# Patient Record
Sex: Female | Born: 1980 | Race: Black or African American | Hispanic: No | Smoking: Never smoker
Health system: Southern US, Community
[De-identification: ages and names within clinical notes are randomized; demographics above are authoritative.]

## PROBLEM LIST (undated history)

## (undated) DIAGNOSIS — F329 Major depressive disorder, single episode, unspecified: Secondary | ICD-10-CM

## (undated) DIAGNOSIS — I7782 Antineutrophilic cytoplasmic antibody (ANCA) vasculitis: Secondary | ICD-10-CM

## (undated) DIAGNOSIS — I10 Essential (primary) hypertension: Secondary | ICD-10-CM

## (undated) DIAGNOSIS — D649 Anemia, unspecified: Secondary | ICD-10-CM

## (undated) DIAGNOSIS — I776 Arteritis, unspecified: Secondary | ICD-10-CM

## (undated) DIAGNOSIS — J45909 Unspecified asthma, uncomplicated: Secondary | ICD-10-CM

## (undated) DIAGNOSIS — F419 Anxiety disorder, unspecified: Secondary | ICD-10-CM

## (undated) DIAGNOSIS — E559 Vitamin D deficiency, unspecified: Secondary | ICD-10-CM

## (undated) DIAGNOSIS — F32A Depression, unspecified: Secondary | ICD-10-CM

## (undated) DIAGNOSIS — M797 Fibromyalgia: Secondary | ICD-10-CM

## (undated) DIAGNOSIS — K219 Gastro-esophageal reflux disease without esophagitis: Secondary | ICD-10-CM

## (undated) HISTORY — PX: OTHER SURGICAL HISTORY: SHX169

## (undated) HISTORY — PX: ABDOMINAL HYSTERECTOMY: SHX81

## (undated) HISTORY — PX: CHOLECYSTECTOMY: SHX55

---

## 2014-11-27 ENCOUNTER — Ambulatory Visit: Payer: Medicaid Other | Attending: Orthopedic Surgery | Admitting: Physical Therapy

## 2014-11-27 ENCOUNTER — Encounter: Payer: Self-pay | Admitting: Physical Therapy

## 2014-11-27 DIAGNOSIS — M25562 Pain in left knee: Secondary | ICD-10-CM | POA: Diagnosis not present

## 2014-11-27 DIAGNOSIS — R531 Weakness: Secondary | ICD-10-CM | POA: Insufficient documentation

## 2014-11-27 NOTE — Therapy (Signed)
[facility] [address] [place], [place], [address] Phone: [phone]   Fax:  [phone]  Physical Therapy Evaluation  Patient Details  Name: Madeline Carr MRN: [id number] Date of Birth: 1980-08-13 Referring Provider:  Dedra Skeens, PA-C  Encounter Date: 11/27/2014      PT End of Session - 11/27/14 1406    Visit Number 1   Number of Visits 1   Authorization - Visit Number 1   Authorization - Number of Visits 1   PT Start Time [phone]   PT Stop Time 1025   PT Time Calculation (min) 47 min   Activity Tolerance Patient tolerated treatment well;Patient limited by pain   Behavior During Therapy [facility] for tasks assessed/performed      History reviewed. No pertinent past medical history.  History reviewed. No pertinent past surgical history.  There were no vitals filed for this visit.  Visit Diagnosis:  Pain in joint of left knee  Generalized weakness      Subjective Assessment - 11/27/14 1739    Subjective Pt reports no injury to L knee just gradual onset of pain starting in May of 2016. Pt reports 9/10 L knee pain. Pt reports only being able to do one flight of steps in her home. Pt states she is unable to squat down and pick up her 32 year old daughter due to knee pain.    Limitations Sitting;Standing;Walking;House hold activities   How long can you sit comfortably? < 20 min   How long can you stand comfortably? < 20 min    How long can you walk comfortably? < 20 min   Diagnostic tests XRAY   Patient Stated Goals return to walking/playing with children/household activities   Currently in Pain? Yes   Pain Score 9    Pain Location Knee   Pain Orientation Anterior;Left   Pain Descriptors / Indicators Burning;Constant   Pain Type Chronic pain   Pain Onset More than a month ago   Pain Frequency Constant     OBJECTIVE: There ex: squats with toes straight/toes out/toes x 10 each. Standing hip abduction x10 each leg. Standing  marching. (given as HEP). Attempted stretching to piriformis, hip flexors and hamstring limited by muscle guarding due to pain/fearful of pain.         PT Education - 11/27/14 1405    Education provided Yes   Education Details See handout above. Pt educated on importance of icing and wearing her knee brace.    Person(s) Educated Patient   Methods Explanation;Demonstration;Verbal cues;Handout   Comprehension Returned demonstration;Verbalized understanding            Plan - 11/27/14 1727    Clinical Impression Statement Pt is a 34 y.o F who presents with L knee pain onset in May 2016 with unspecificed origin. Pt ambulates with a SPC on R side for all functional mobility and is only performing one flight of stairs at home due to knee pain. Pt reports L knee pain at a 9/10 currently at rest. Attempted to MMT L LE but pt unable to acheive proper position. Pt's R LE strength grossly 4/5 MMT.  Tigthness and impaired flexibility with hamstring stretching noted B.  Pt unable to tolerate patellar mobilizations on the L knee due to pain.  L knee edema noted as compared to R.  Pt educated on HEP for quad strengthening and stretching to decrease stress on the anterior knee. LEFS: 17/80.  Pt. limited to 1 evaluation/treatment due  to insurance limitations.  Pt. will be referred to [facility] clinic to continue skilled PT services.     Pt will benefit from skilled therapeutic intervention in order to improve on the following deficits Abnormal gait;Decreased activity tolerance;Decreased mobility;Increased edema;Decreased strength;Impaired flexibility;Improper body mechanics;Postural dysfunction;Decreased range of motion;Decreased endurance   Rehab Potential Good   PT Frequency 1x / week   PT Duration --  1 week   PT Treatment/Interventions ADLs/Self Care Home Management;Cryotherapy;Moist Heat;Balance training;Therapeutic exercise;Therapeutic activities;Functional mobility training;Stair training;Gait  training;Manual techniques   PT Next Visit Plan 1x visit only.  Referred to [facility].   PT Home Exercise Plan see above handout.    Consulted and Agree with Plan of Care Patient         Problem List There are no active problems to display for this patient.  Cammie Mcgee, PT, DPT # [phone]   11/28/2014, 9:36 AM  [facility] [address] [place], [place], [address] Phone: [phone]   Fax:  [phone]

## 2014-12-02 ENCOUNTER — Ambulatory Visit
Admission: EM | Admit: 2014-12-02 | Discharge: 2014-12-02 | Disposition: A | Discharge status: Home / Self Care | Payer: Medicaid Other | Attending: Emergency Medicine | Admitting: Emergency Medicine

## 2014-12-02 DIAGNOSIS — M222X2 Patellofemoral disorders, left knee: Secondary | ICD-10-CM

## 2014-12-02 HISTORY — DX: Gastro-esophageal reflux disease without esophagitis: K21.9

## 2014-12-02 HISTORY — DX: Unspecified asthma, uncomplicated: J45.909

## 2014-12-02 HISTORY — DX: Fibromyalgia: M79.7

## 2014-12-02 HISTORY — DX: Depression, unspecified: F32.A

## 2014-12-02 HISTORY — DX: Major depressive disorder, single episode, unspecified: F32.9

## 2014-12-02 HISTORY — DX: Anxiety disorder, unspecified: F41.9

## 2014-12-02 MED ORDER — KETOROLAC TROMETHAMINE 60 MG/2ML IM SOLN
60.0000 mg | Freq: Once | INTRAMUSCULAR | Status: AC
  Administered 2014-12-02: 60 mg via INTRAMUSCULAR

## 2014-12-02 NOTE — ED Notes (Addendum)
Left knee pain since May 2016. Almedia Balls PA with [facility] on 11/22/14 and Dx with Patellofemoral Stress syndrome. Had Labs drawn and elevated c-reactive protein and elevated sed rate. Waiting for an appointment with Rheumatology. Had PT evaluation last Wednesday.

## 2014-12-03 NOTE — ED Provider Notes (Signed)
CSN: [id number]     Arrival date & time 12/02/14  1608 History   First MD Initiated Contact with Patient 12/02/14 1741     Chief Complaint  Patient presents with  . Knee Pain   (Consider location/radiation/quality/duration/timing/severity/associated sxs/prior Treatment) HPI 34 yo F presents complaining of left knee pain. Reports present since May 2016. Noted as she climbed into the drivers seat -spontaneous onset -no trauma remembered. Pain centered around the left knee. Difficulty weight bearing. Has used home cane and has ice wraps. With review-she is currently under the direct care of Orthopedics. She was seen on 9/16 and diagnoses with patellofemoral stress syndrome. It was understood this would be an uncomfortable healing process.Possibly delayed by habitus at 300 lbs, She was placed on Meloxicam 7.5 mg BID but on review has been taking it as well as 600-800 mg of ibuprofen multiple times a day. Experience nausea and vomiting and belly pain just a few days ago Drove herself to [facility] this evening Had elevated CRP and Sed Rate noted during previous visit with Ortho- they are setting up Rheumatology referral/re-evaluation Past Medical History  Diagnosis Date  . GERD (gastroesophageal reflux disease)   . Asthma   . Anxiety   . Depression   . Fibromyalgia    Past Surgical History  Procedure Laterality Date  . Cholecystectomy    . Abdominal hysterectomy    . Cesarean section      2005; 2011  . Uterine ablation     Family History  Problem Relation Age of Onset  . Thyroid disease Mother    Social History  Substance Use Topics  . Smoking status: Never Smoker   . Smokeless tobacco: None  . Alcohol Use: No   OB History    No data available    ,lwl Review of Systems Constitutional -afebrile Eyes-denies visual changes ENT- normal voice,denies sore throat CV-denies chest pain Resp-denies SOB GI- negative for nausea,vomiting,  Currently- had episode a few days ago GU-  negative for dysuria MSK- negative for back pain, ambulatory with can , antalgic , favoring left knee Skin- denies acute changes Neuro- negative headache,focal weakness or numbness    Allergies  Prevacid and Shellfish allergy  Home Medications   Prior to Admission medications   Medication Sig Start Date End Date Taking? Authorizing Provider  albuterol (PROVENTIL HFA;VENTOLIN HFA) 108 (90 BASE) MCG/ACT inhaler Inhale into the lungs every 6 (six) hours as needed for wheezing or shortness of breath.   Yes Historical Provider, MD  DULoxetine (CYMBALTA) 60 MG capsule Take 60 mg by mouth daily.   Yes Historical Provider, MD  ergocalciferol (VITAMIN D2) 50000 UNITS capsule Take 50,000 Units by mouth once a week.   Yes Historical Provider, MD  gabapentin (NEURONTIN) 600 MG tablet Take 600 mg by mouth 3 (three) times daily.   Yes Historical Provider, MD  lisinopril-hydrochlorothiazide (PRINZIDE,ZESTORETIC) 20-12.5 MG per tablet Take 1 tablet by mouth daily.   Yes Historical Provider, MD  meloxicam (MOBIC) 7.5 MG tablet Take 7.5 mg by mouth daily.   Yes Historical Provider, MD  omeprazole (PRILOSEC) 40 MG capsule Take 40 mg by mouth daily.   Yes Historical Provider, MD  QUEtiapine (SEROQUEL) 300 MG tablet Take 300 mg by mouth at bedtime.   Yes Historical Provider, MD  sertraline (ZOLOFT) 100 MG tablet Take 100 mg by mouth daily.   Yes Historical Provider, MD  dicyclomine (BENTYL) 20 MG tablet Take 20 mg by mouth every 6 (six) hours.    Historical  Provider, MD   Meds Ordered and Administered this Visit   Medications  ketorolac (TORADOL) injection 60 mg (60 mg Intramuscular Given 12/02/14 1836)    BP 127/97 mmHg  Pulse 108  Temp(Src) 98.5 F (36.9 C) (Tympanic)  Resp 16  Ht  (1.702 m)  Wt 300 lb (136.079 kg)  BMI 46.98 kg/m2  SpO2 99% No data found.  Constitutional: Alert and oriented, well appearing, VS are noted,  General : mild distress left knee HEENT:  Head:normocephalic,  atraumatic,                Eyes: conjugate gaze,negative conjunctiva     Ears:hearing normal     Nose:normal,     Mouth/throat :Mucous membranes moist Neck :  supple without thyromegaly Lymph: without enlargement Lung:   effort and breath sounds normal , no distress Heart:   normal rate,regular rhythm,  Back:    No CVAT, no spinal tenderness noted Abd :    soft, nontender, bowel sounds present, no mid-epigastric tenderness noted MSK:   Ambulatory with cane and antalgic. got on and off table without assistance; left knee no evidence of increased swelling or fluid; patient  c/o tenderness, good pulses, normal sensation reported by patient. Leg is large and knee specific landmarks difficult to pinpoint. Indicated the entire knee area as that which is uncomfortable. Neuro:;  normal speech and language Skin:  Warm,dry,intact Psych: mood and affect WNL Physical Exam As noted ED Course  Procedures (including critical care time)  Labs Review Labs Reviewed - No data to display  Imaging Review No results found.  Discussed ice and elevation. Strongly discouraged the use of po Ibuprofen especially in large doses to patient.while taking Meloxicam on standard dose . Need to use Tylenol if back up additional is needed. Has not used ibuprofen today. Will Rx with Ketorolac as she is driving herself and she is  alone this weekend Medications  ketorolac (TORADOL) injection 60 mg (60 mg Intramuscular Given 12/02/14 1836)  got significant improvement in first 30 minutes and when eady for discharge  Plan is to call Ortho in the morning and discuss inadequate pain control so another plan can be developed. F/U currently not scheduled Ortho  for another 2 weeks Follow up on plan for Rheumatology visit  For specifics on your referral. Continue ice, elevation and use of crutches or cane to reduce weightbearing as desired  MDM   1. Patellofemoral stress syndrome of left knee     Diagnosis and treatment  discussed. . Questions fielded, expectations and recommendations reviewed.  Patient expresses understanding. Will return to [facility] with questions, concern or exacerbation.    Rae Halsted, PA-C 12/04/14 0003

## 2014-12-04 ENCOUNTER — Encounter: Payer: Self-pay | Admitting: Physician Assistant

## 2015-03-07 ENCOUNTER — Other Ambulatory Visit: Payer: Self-pay | Admitting: Rheumatology

## 2015-03-07 DIAGNOSIS — R7989 Other specified abnormal findings of blood chemistry: Secondary | ICD-10-CM

## 2015-03-07 DIAGNOSIS — G8929 Other chronic pain: Secondary | ICD-10-CM

## 2015-03-07 DIAGNOSIS — R945 Abnormal results of liver function studies: Principal | ICD-10-CM

## 2015-03-07 DIAGNOSIS — M25562 Pain in left knee: Principal | ICD-10-CM

## 2015-03-13 ENCOUNTER — Ambulatory Visit
Admission: RE | Admit: 2015-03-13 | Discharge: 2015-03-13 | Disposition: A | Discharge status: Home / Self Care | Payer: Medicaid Other | Source: Ambulatory Visit | Attending: Rheumatology | Admitting: Rheumatology

## 2015-03-13 DIAGNOSIS — R7989 Other specified abnormal findings of blood chemistry: Secondary | ICD-10-CM

## 2015-03-13 DIAGNOSIS — R945 Abnormal results of liver function studies: Secondary | ICD-10-CM | POA: Diagnosis not present

## 2015-03-28 ENCOUNTER — Ambulatory Visit
Admission: RE | Admit: 2015-03-28 | Discharge: 2015-03-28 | Disposition: A | Discharge status: Home / Self Care | Payer: Medicaid Other | Source: Ambulatory Visit | Attending: Rheumatology | Admitting: Rheumatology

## 2015-03-28 DIAGNOSIS — M2242 Chondromalacia patellae, left knee: Secondary | ICD-10-CM | POA: Diagnosis not present

## 2015-03-28 DIAGNOSIS — M25562 Pain in left knee: Secondary | ICD-10-CM | POA: Insufficient documentation

## 2015-03-28 DIAGNOSIS — G8929 Other chronic pain: Secondary | ICD-10-CM | POA: Diagnosis present

## 2015-03-28 DIAGNOSIS — M25462 Effusion, left knee: Secondary | ICD-10-CM | POA: Diagnosis not present

## 2015-05-20 ENCOUNTER — Ambulatory Visit
Admission: EM | Admit: 2015-05-20 | Discharge: 2015-05-20 | Disposition: A | Discharge status: Home / Self Care | Payer: Medicaid Other | Attending: Family Medicine | Admitting: Family Medicine

## 2015-05-20 DIAGNOSIS — H0016 Chalazion left eye, unspecified eyelid: Secondary | ICD-10-CM

## 2015-05-20 HISTORY — DX: Anemia, unspecified: D64.9

## 2015-05-20 HISTORY — DX: Vitamin D deficiency, unspecified: E55.9

## 2015-05-20 HISTORY — DX: Essential (primary) hypertension: I10

## 2015-05-20 MED ORDER — CEFUROXIME AXETIL 500 MG PO TABS: 500.0000 mg | ORAL_TABLET | Freq: Two times a day (BID) | ORAL | Status: DC

## 2015-05-20 NOTE — ED Provider Notes (Signed)
CSN: [id number]     Arrival date & time 05/20/15  1753 History   First MD Initiated Contact with Patient 05/20/15 1954     Chief Complaint  Patient presents with  . Eye Pain  . Facial Swelling   (Consider location/radiation/quality/duration/timing/severity/associated sxs/prior Treatment) HPI   This a pleasant 35 year old female with a left swollen eye with pain and tenderness of the orbit. Noticed a painful lump on the top of her eyelid yesterday. Denies any fever or chills. She states that she has a slight amount of light sensitivity. He also has a feeling of a foreign body but when questioned further it is actually the eyelid swelling that is bothering her. She has been applying warm compresses it is so seems to worsen his symptoms improved. Visual acuity is over 25 on the right 20/40 on the left and bilateral 20/30 uncorrected. This was formed with the Snellen chart.  Past Medical History  Diagnosis Date  . GERD (gastroesophageal reflux disease)   . Asthma   . Anxiety   . Depression   . Fibromyalgia   . Hypertension   . Anemia   . Vitamin D deficiency    Past Surgical History  Procedure Laterality Date  . Cholecystectomy    . Abdominal hysterectomy    . Cesarean section      2005; 2011  . Uterine ablation     Family History  Problem Relation Age of Onset  . Thyroid disease Mother    Social History  Substance Use Topics  . Smoking status: Never Smoker   . Smokeless tobacco: None  . Alcohol Use: No   OB History    No data available     Review of Systems  Constitutional: Negative for fever, chills, activity change and fatigue.  Eyes: Positive for photophobia, pain and discharge. Negative for redness, itching and visual disturbance.  All other systems reviewed and are negative.   Allergies  Prevacid and Shellfish allergy  Home Medications   Prior to Admission medications   Medication Sig Start Date End Date Taking? Authorizing Provider  diclofenac sodium  (VOLTAREN) 1 % GEL Apply 4 g topically 4 (four) times daily.   Yes Historical Provider, MD  DULoxetine (CYMBALTA) 60 MG capsule Take 60 mg by mouth daily.   Yes Historical Provider, MD  ergocalciferol (VITAMIN D2) 50000 UNITS capsule Take 50,000 Units by mouth once a week.   Yes Historical Provider, MD  gabapentin (NEURONTIN) 600 MG tablet Take 600 mg by mouth 3 (three) times daily.   Yes Historical Provider, MD  lisinopril-hydrochlorothiazide (PRINZIDE,ZESTORETIC) 20-12.5 MG per tablet Take 1 tablet by mouth daily.   Yes Historical Provider, MD  omeprazole (PRILOSEC) 40 MG capsule Take 40 mg by mouth daily.   Yes Historical Provider, MD  QUEtiapine (SEROQUEL) 300 MG tablet Take 300 mg by mouth at bedtime.   Yes Historical Provider, MD  sertraline (ZOLOFT) 100 MG tablet Take 100 mg by mouth daily.   Yes Historical Provider, MD  albuterol (PROVENTIL HFA;VENTOLIN HFA) 108 (90 BASE) MCG/ACT inhaler Inhale into the lungs every 6 (six) hours as needed for wheezing or shortness of breath.    Historical Provider, MD  cefUROXime (CEFTIN) 500 MG tablet Take 1 tablet (500 mg total) by mouth 2 (two) times daily with a meal. 05/20/15   Lutricia Feil, PA-C  dicyclomine (BENTYL) 20 MG tablet Take 20 mg by mouth every 6 (six) hours.    Historical Provider, MD  meloxicam (MOBIC) 7.5 MG tablet Take  7.5 mg by mouth daily.    Historical Provider, MD   Meds Ordered and Administered this Visit  Medications - No data to display  BP 129/90 mmHg  Pulse 105  Temp(Src) 98.1 F (36.7 C) (Oral)  Resp 20  Ht 5\' 7"  (1.702 m)  Wt 300 lb (136.079 kg)  BMI 46.98 kg/m2  SpO2 100% No data found.   Physical Exam  Constitutional: She is oriented to person, place, and time. She appears well-developed and well-nourished. No distress.  HENT:  Head: Normocephalic and atraumatic.  Eyes: Conjunctivae and EOM are normal. Pupils are equal, round, and reactive to light. Right eye exhibits no discharge. Left eye exhibits  discharge.  Examination of the left eye shows swelling of the upper lid. This appears to be more lateral. Pupils equal and reactive to light and accommodation EOMs are full and intact. Injected is not injected. Attempt to you for the eyelid was not successful as the patient had too much to discomfort. It appears that she has a chalazion on the undersurface of the upper lid more lateral.  Neck: Normal range of motion. Neck supple.  Musculoskeletal: Normal range of motion.  Lymphadenopathy:    She has no cervical adenopathy.  Neurological: She is alert and oriented to person, place, and time.  Skin: Skin is warm and dry. She is not diaphoretic.  Psychiatric: She has a normal mood and affect. Her behavior is normal. Judgment and thought content normal.  Nursing note and vitals reviewed.   ED Course  Procedures (including critical care time)  Labs Review Labs Reviewed - No data to display  Imaging Review No results found.   Visual Acuity Review  Right Eye Distance: 20/25 (Uncorrected) Left Eye Distance: 20/40 (Uncorrected) Bilateral Distance: 20/30 (Uncorrected)  Right Eye Near:   Left Eye Near:    Bilateral Near:         MDM   1. Chalazion of left eye    Discharge Medication List as of 05/20/2015  8:20 PM    START taking these medications   Details  cefUROXime (CEFTIN) 500 MG tablet Take 1 tablet (500 mg total) by mouth 2 (two) times daily with a meal., Starting 05/20/2015, Until Discontinued, Normal      Plan: 1. Test/x-ray results and diagnosis reviewed with patient 2. rx as per orders; risks, benefits, potential side effects reviewed with patient 3. Recommend supportive treatment with Continued warm compresses as outlined on the information sent home with the patient. I decided place her on no oral antibiotics because of the tenderness that she has in the orbit lateral to the touch chalazion. Because of the amount of pain that she is having I have recommended that she  make an appointment with [facility] tomorrow for next week. This may require I&D. She will contact [facility] tomorrow. 4. F/u prn if symptoms worsen or don't improve     Lutricia FeilWilliam P Rachele Lamaster, PA-C 05/20/15 2029

## 2015-05-20 NOTE — Discharge Instructions (Signed)
Chalazion A chalazion is a swelling or lump on the eyelid. It can affect the upper or lower eyelid. CAUSES This condition may be caused by:  Long-lasting (chronic) inflammation of the eyelid glands.  A blocked oil gland in the eyelid. SYMPTOMS Symptoms of this condition include:  A swelling on the eyelid. The swelling may spread to areas around the eye.  A hard lump on the eyelid. This lump may make it hard to see out of the eye. DIAGNOSIS This condition is diagnosed with an examination of the eye. TREATMENT This condition is treated by applying a warm compress to the eyelid. If the condition does not improve after two days, it may be treated with:  Surgery.  Medicine that is injected into the chalazion by a health care provider.  Medicine that is applied to the eye. HOME CARE INSTRUCTIONS  Do not touch the chalazion.  Do not try to remove the pus, such as by squeezing the chalazion or sticking it with a pin or needle.  Do not rub your eyes.  Wash your hands often. Dry your hands with a clean towel.  Keep your face, scalp, and eyebrows clean.  Avoid wearing eye makeup.  Apply a warm, moist compress to the eyelid 4-6 times a day for 10-15 minutes at a time. This will help to open any blocked glands and help to reduce redness and swelling.  Apply over-the-counter and prescription medicines only as told by your health care provider.  If the chalazion does not break open (rupture) on its own in a month, return to your health care provider.  Keep all follow-up appointments as told by your health care provider. This is important. SEEK MEDICAL CARE IF:  Your eyelid has not improved in 4 weeks.  Your eyelid is getting worse.  You have a fever.  The chalazion does not rupture on its own with home treatment in a month. SEEK IMMEDIATE MEDICAL CARE IF:  You have pain in your eye.  Your vision changes.  The chalazion becomes painful or red  The chalazion gets  bigger.   This information is not intended to replace advice given to you by your health care provider. Make sure you discuss any questions you have with your health care provider.   Document Released: 02/20/2000 Document Revised: 11/13/2014 Document Reviewed: 06/17/2014 Elsevier Interactive Patient Education 2016 [employer]  

## 2015-05-20 NOTE — ED Notes (Signed)
Patient c/o swollen left eye with pain and tenderness.  She also noticed a painful lump on the top of left eye lid this past Monday.  Denies fever/c/n/v or chest pain.

## 2016-03-10 ENCOUNTER — Ambulatory Visit
Admission: EM | Admit: 2016-03-10 | Discharge: 2016-03-10 | Disposition: A | Discharge status: Home / Self Care | Payer: Medicaid Other | Attending: Family Medicine | Admitting: Family Medicine

## 2016-03-10 ENCOUNTER — Encounter: Payer: Self-pay | Admitting: Emergency Medicine

## 2016-03-10 DIAGNOSIS — F329 Major depressive disorder, single episode, unspecified: Secondary | ICD-10-CM | POA: Diagnosis not present

## 2016-03-10 DIAGNOSIS — B349 Viral infection, unspecified: Secondary | ICD-10-CM | POA: Insufficient documentation

## 2016-03-10 DIAGNOSIS — K219 Gastro-esophageal reflux disease without esophagitis: Secondary | ICD-10-CM | POA: Diagnosis not present

## 2016-03-10 DIAGNOSIS — F419 Anxiety disorder, unspecified: Secondary | ICD-10-CM | POA: Diagnosis not present

## 2016-03-10 DIAGNOSIS — Z90710 Acquired absence of both cervix and uterus: Secondary | ICD-10-CM | POA: Diagnosis not present

## 2016-03-10 DIAGNOSIS — B9789 Other viral agents as the cause of diseases classified elsewhere: Secondary | ICD-10-CM | POA: Diagnosis not present

## 2016-03-10 DIAGNOSIS — R0981 Nasal congestion: Secondary | ICD-10-CM | POA: Diagnosis present

## 2016-03-10 DIAGNOSIS — I10 Essential (primary) hypertension: Secondary | ICD-10-CM | POA: Insufficient documentation

## 2016-03-10 DIAGNOSIS — J069 Acute upper respiratory infection, unspecified: Secondary | ICD-10-CM | POA: Insufficient documentation

## 2016-03-10 DIAGNOSIS — M797 Fibromyalgia: Secondary | ICD-10-CM | POA: Insufficient documentation

## 2016-03-10 DIAGNOSIS — E559 Vitamin D deficiency, unspecified: Secondary | ICD-10-CM | POA: Insufficient documentation

## 2016-03-10 DIAGNOSIS — R05 Cough: Secondary | ICD-10-CM | POA: Diagnosis present

## 2016-03-10 DIAGNOSIS — J45901 Unspecified asthma with (acute) exacerbation: Secondary | ICD-10-CM

## 2016-03-10 LAB — RAPID INFLUENZA A&B ANTIGENS (ARMC ONLY)
INFLUENZA A (ARMC): NEGATIVE
INFLUENZA B (ARMC): NEGATIVE

## 2016-03-10 MED ORDER — IPRATROPIUM-ALBUTEROL 0.5-2.5 (3) MG/3ML IN SOLN
3.0000 mL | Freq: Four times a day (QID) | RESPIRATORY_TRACT | Status: DC
  Administered 2016-03-10: 3 mL via RESPIRATORY_TRACT

## 2016-03-10 MED ORDER — PREDNISONE 10 MG PO TABS: ORAL_TABLET | ORAL | 0 refills | Status: DC

## 2016-03-10 NOTE — ED Triage Notes (Signed)
Patient c/o cough, SOB and runny nose that started 2 days.  Patient denies fevers.

## 2016-03-10 NOTE — ED Provider Notes (Signed)
[facility] ____________________________________________  Time seen: Approximately 12:54 PM  I have reviewed the triage vital signs and the nursing notes.   HISTORY  Chief Complaint Nasal Congestion and Cough   HPI Madeline Carr is a 36 y.o. female presenting for the complaints of 2-3 days of runny nose, nasal congestion and cough. Reports some accompanying wheezing and intermittent shortness of breath. Patient reports history of asthma and symptoms feel consistent with asthma flareup. Patient denies known fevers. Patient reports daughter with similar cold symptoms. Also reports she recently had nausea vomiting and diarrhea last week in which she was seen in the ER for dehydration. Patient states that she is improving from those symptoms, but reports she was around multiple sick contacts when in the ER. Patient reports she does still have occasional vomiting, but states primarily from coughing multiple times back-to-back. Patient reports her home albuterol inhaler and home nebulizer does help briefly with the cough and wheezing, but reports wheezing shortly returns. Reports able to eat and drink well now. Denies current abdominal pain. States cough is primarily nonproductive. Reports clear nasal drainage. Denies any chest pain. Denies fever, dysuria, vaginal complaints, extremity pain or extremity swelling. Denies any recent prednisone use in > 6 months.  No LMP recorded. Patient has had a hysterectomy.  Past Medical History:  Diagnosis Date  . Anemia   . Anxiety   . Asthma   . Depression   . Fibromyalgia   . GERD (gastroesophageal reflux disease)   . Hypertension   . Vitamin D deficiency     There are no active problems to display for this patient.   Past Surgical History:  Procedure Laterality Date  . ABDOMINAL HYSTERECTOMY    . CESAREAN SECTION     2005; 2011  . CHOLECYSTECTOMY    . uterine ablation      Current Outpatient Rx  . Order #: [id number] Class:  Historical Med  . Order #: [id number] Class: Historical Med  . Order #: [id number] Class: Historical Med  . Order #: [id number] Class: Historical Med  . Order #: [id number] Class: Historical Med  . Order #: [id number] Class: Historical Med  . Order #: [id number] Class: Historical Med  . Order #: [id number] Class: Historical Med  . Order #: [id number] Class: Historical Med  . Order #: [id number] Class: Historical Med  . Order #: [id number] Class: Normal  . Order #: [id number] Class: Historical Med     Current Facility-Administered Medications:  .  ipratropium-albuterol (DUONEB) 0.5-2.5 (3) MG/3ML nebulizer solution 3 mL, 3 mL, Nebulization, Q6H, Renford Dills, NP, 3 mL at 03/10/16 1251  Current Outpatient Prescriptions:  .  clonazePAM (KLONOPIN) 0.5 MG tablet, Take 0.5 mg by mouth 2 (two) times daily as needed for anxiety., Disp: , Rfl:  .  QUEtiapine (SEROQUEL) 400 MG tablet, Take 400 mg by mouth at bedtime., Disp: , Rfl:  .  albuterol (PROVENTIL HFA;VENTOLIN HFA) 108 (90 BASE) MCG/ACT inhaler, Inhale into the lungs every 6 (six) hours as needed for wheezing or shortness of breath., Disp: , Rfl:  .  diclofenac sodium (VOLTAREN) 1 % GEL, Apply 4 g topically 4 (four) times daily., Disp: , Rfl:  .  dicyclomine (BENTYL) 20 MG tablet, Take 20 mg by mouth every 6 (six) hours., Disp: , Rfl:  .  DULoxetine (CYMBALTA) 60 MG capsule, Take 60 mg by mouth daily., Disp: , Rfl:  .  ergocalciferol (VITAMIN D2) 50000 UNITS capsule, Take 50,000 Units by mouth once a week., Disp: , Rfl:  .  gabapentin (NEURONTIN) 600 MG tablet, Take 600  mg by mouth 3 (three) times daily., Disp: , Rfl:  .  lisinopril-hydrochlorothiazide (PRINZIDE,ZESTORETIC) 20-12.5 MG per tablet, Take 1 tablet by mouth daily., Disp: , Rfl:  .  omeprazole (PRILOSEC) 40 MG capsule, Take 40 mg by mouth daily., Disp: , Rfl:  .  predniSONE (DELTASONE) 10 MG tablet, Start 60 mg po day one, then 50 mg po day two, taper by 10 mg daily until complete., Disp: 21  tablet, Rfl: 0 .  sertraline (ZOLOFT) 100 MG tablet, Take 100 mg by mouth daily., Disp: , Rfl:   Allergies Prevacid [lansoprazole] and Shellfish allergy  Family History  Problem Relation Age of Onset  . Thyroid disease Mother     Social History Social History  Substance Use Topics  . Smoking status: Never Smoker  . Smokeless tobacco: Never Used  . Alcohol use No    Review of Systems Constitutional: No fever/chills Eyes: No visual changes. ENT: No sore throat. Cardiovascular: Denies chest pain. Respiratory: As above.  Gastrointestinal: No abdominal pain.  No nausea, no vomiting.  No diarrhea.  No constipation. Genitourinary: Negative for dysuria. Musculoskeletal: Negative for back pain. Skin: Negative for rash. Neurological: Negative for headaches, focal weakness or numbness.  10-point ROS otherwise negative.  ____________________________________________   PHYSICAL EXAM:  VITAL SIGNS: ED Triage Vitals  Enc Vitals Group     BP 03/10/16 1219 119/81     Pulse Rate 03/10/16 1219 84     Resp 03/10/16 1219 17     Temp 03/10/16 1219 98.4 F (36.9 C)     Temp Source 03/10/16 1219 Oral     SpO2 03/10/16 1219 100 %     Weight 03/10/16 1217 (!) 306 lb (138.8 kg)     Height 03/10/16 1217 5\' 7"  (1.702 m)     Head Circumference --      Peak Flow --      Pain Score 03/10/16 1218 0     Pain Loc --      Pain Edu? --      Excl. in GC? --    Constitutional: Alert and oriented. Well appearing and in no acute distress. Eyes: Conjunctivae are normal. PERRL. EOMI. Head: Atraumatic. No sinus tenderness to palpation. No swelling. No erythema.  Ears: no erythema, normal TMs bilaterally.   Nose:Nasal congestion with clear rhinorrhea  Mouth/Throat: Mucous membranes are moist. No pharyngeal erythema. No tonsillar swelling or exudate.  Neck: No stridor.  No cervical spine tenderness to palpation. Hematological/Lymphatic/Immunilogical: No cervical lymphadenopathy. Cardiovascular:  Normal rate, regular rhythm. Grossly normal heart sounds.  Good peripheral circulation. Respiratory: Normal respiratory effort.  No retractions. No rhonchi or rales. Mild scattered inspiratory and expiratory wheezes throughout. Speaks in complete sentences. Dry intermittent cough noted room. Good air movement.  Gastrointestinal: Soft and nontender. Obese abdomen. No CVA tenderness. Musculoskeletal: No lower or upper extremity tenderness nor edema. No cervical, thoracic or lumbar tenderness to palpation. Neurologic:  Normal speech and language.  No gait instability. Skin:  Skin is warm, dry and intact. No rash noted. Psychiatric: Mood and affect are normal. Speech and behavior are normal.  ___________________________________________   LABS (all labs ordered are listed, but only abnormal results are displayed)  Labs Reviewed  RAPID INFLUENZA A&B ANTIGENS ([facility] ONLY)     PROCEDURES Procedures     INITIAL IMPRESSION / ASSESSMENT AND PLAN / ED COURSE  Pertinent labs & imaging results that were available during my care of the patient were reviewed by me and considered in my medical decision  making (see chart for details).  Overall well-appearing patient. No acute distress. Suspect viral infection with secondary asthma exacerbation. Discussed in detail with patient we'll defer chest x-ray at this time has no fevers, no rhonchi no focal area consolidation auscultated patient agrees to this plan. DuoNeb given once in urgent care. Will evaluate influenza.  After DuoNeb, patient reevaluated, wheezes almost fully resolved. Patient reports feeling much better. Influenza negative. Discussed with patient suspect viral infection with asthma exacerbation. Discussed continued use of home albuterol treatments. Will start prednisone 6 day taper. Encourage rest, fluids and supportive care. Discussed need for close follow-up if symptoms continue or no improvement. Discussed indication, risks and benefits of  medications with patient.  Discussed follow up with Primary care physician this week. Discussed follow up and return parameters including no resolution or any worsening concerns. Patient verbalized understanding and agreed to plan.   ____________________________________________   FINAL CLINICAL IMPRESSION(S) / ED DIAGNOSES  Final diagnoses:  Viral URI with cough  Exacerbation of asthma, unspecified asthma severity, unspecified whether persistent     New Prescriptions   PREDNISONE (DELTASONE) 10 MG TABLET    Start 60 mg po day one, then 50 mg po day two, taper by 10 mg daily until complete.    Note: This dictation was prepared with Dragon dictation along with smaller phrase technology. Any transcriptional errors that result from this process are unintentional.    Clinical Course       Renford Dills, NP 03/10/16 1323

## 2016-03-10 NOTE — Discharge Instructions (Signed)
Take medication as prescribed. Rest. Drink plenty of fluids. Use home albuterol as prescribed.   Follow up with your primary care physician this week as needed. Return to Urgent care for new or worsening concerns.

## 2017-06-25 ENCOUNTER — Encounter: Payer: Self-pay | Admitting: Gynecology

## 2017-06-25 ENCOUNTER — Ambulatory Visit
Admission: EM | Admit: 2017-06-25 | Discharge: 2017-06-25 | Disposition: A | Discharge status: Home / Self Care | Payer: Self-pay | Attending: Family Medicine | Admitting: Family Medicine

## 2017-06-25 ENCOUNTER — Other Ambulatory Visit: Payer: Self-pay

## 2017-06-25 DIAGNOSIS — J040 Acute laryngitis: Secondary | ICD-10-CM

## 2017-06-25 DIAGNOSIS — J40 Bronchitis, not specified as acute or chronic: Secondary | ICD-10-CM

## 2017-06-25 MED ORDER — BENZONATATE 100 MG PO CAPS: 100.0000 mg | ORAL_CAPSULE | Freq: Three times a day (TID) | ORAL | 0 refills | Status: DC | PRN

## 2017-06-25 MED ORDER — ALBUTEROL SULFATE HFA 108 (90 BASE) MCG/ACT IN AERS: 1.0000 | INHALATION_SPRAY | Freq: Four times a day (QID) | RESPIRATORY_TRACT | 0 refills | Status: AC | PRN

## 2017-06-25 MED ORDER — PREDNISONE 50 MG PO TABS: ORAL_TABLET | ORAL | 0 refills | Status: DC

## 2017-06-25 NOTE — Discharge Instructions (Signed)
This likely viral.  Albuterol as needed. Cough medication as prescribed.  If hoarseness persists, please see ENT.   Take care  Dr. Adriana Simasook

## 2017-06-25 NOTE — ED Provider Notes (Signed)
[facility]    CSN: [id number] Arrival date & time: 06/25/17  1210  History   Chief Complaint Chief Complaint  Patient presents with  . Cough   HPI  37 year old female presents with hoarseness, cough, and congestion.  Patient reports a one-week history of hoarseness, cough and chest congestion.  No fever.  She reports chest tightness and shortness of breath as well.  She been using over-the-counter treatments as well as albuterol without symptom improvement.  Symptoms are severe.  No known exacerbating factors.  No other associated symptoms.  No other complaints/concerns.  Past Medical History:  Diagnosis Date  . Anemia   . Anxiety   . Asthma   . Depression   . Fibromyalgia   . GERD (gastroesophageal reflux disease)   . Hypertension   . Vitamin D deficiency    Past Surgical History:  Procedure Laterality Date  . ABDOMINAL HYSTERECTOMY    . CESAREAN SECTION     2005; 2011  . CHOLECYSTECTOMY    . uterine ablation     OB History   None    Home Medications    Prior to Admission medications   Medication Sig Start Date End Date Taking? Authorizing Provider  clonazePAM (KLONOPIN) 0.5 MG tablet Take 0.5 mg by mouth 2 (two) times daily as needed for anxiety.   Yes [provider]  diclofenac sodium (VOLTAREN) 1 % GEL Apply 4 g topically 4 (four) times daily.   Yes [provider]  lisinopril-hydrochlorothiazide (PRINZIDE,ZESTORETIC) 20-12.5 MG per tablet Take 1 tablet by mouth daily.   Yes [provider]  omeprazole (PRILOSEC) 40 MG capsule Take 40 mg by mouth daily.   Yes [provider]  albuterol (PROVENTIL HFA;VENTOLIN HFA) 108 (90 Base) MCG/ACT inhaler Inhale 1-2 puffs into the lungs every 6 (six) hours as needed for wheezing or shortness of breath. 06/25/17   Tommie Sams, DO  benzonatate (TESSALON) 100 MG capsule Take 1 capsule (100 mg total) by mouth 3 (three) times daily as needed. 06/25/17   Tommie Sams, DO    ergocalciferol (VITAMIN D2) 50000 UNITS capsule Take 50,000 Units by mouth once a week.    [provider]  gabapentin (NEURONTIN) 600 MG tablet Take 600 mg by mouth 3 (three) times daily.    [provider]  predniSONE (DELTASONE) 50 MG tablet 1 tablet daily x 5 days. 06/25/17   Tommie Sams, DO  QUEtiapine (SEROQUEL) 400 MG tablet Take 400 mg by mouth at bedtime.    [provider]  sertraline (ZOLOFT) 100 MG tablet Take 100 mg by mouth daily.    [provider]    Family History Family History  Problem Relation Age of Onset  . Thyroid disease Mother     Social History Social History   Tobacco Use  . Smoking status: Never Smoker  . Smokeless tobacco: Never Used  Substance Use Topics  . Alcohol use: No  . Drug use: Not on file   Allergies   Prevacid [lansoprazole] and Shellfish allergy   Review of Systems Review of Systems  Constitutional: Negative for fever.  HENT: Positive for voice change.   Respiratory: Positive for cough, chest tightness and shortness of breath.    Physical Exam Triage Vital Signs ED Triage Vitals  Enc Vitals Group     BP 06/25/17 1229 134/89     Pulse Rate 06/25/17 1229 93     Resp 06/25/17 1229 18     Temp 06/25/17 1229  98.6 F (37 C)     Temp Source 06/25/17 1229 Oral     SpO2 06/25/17 1229 100 %     Weight 06/25/17 1227 286 lb (129.7 kg)     Height 06/25/17 1227 5\' 6"  (1.676 m)     Head Circumference --      Peak Flow --      Pain Score 06/25/17 1227 4     Pain Loc --      Pain Edu? --      Excl. in GC? --    Updated Vital Signs BP 134/89 (BP Location: Left Arm)   Pulse 93   Temp 98.6 F (37 C) (Oral)   Resp 18   Ht 5\' 6"  (1.676 m)   Wt 286 lb (129.7 kg)   SpO2 100%   BMI 46.16 kg/m   Physical Exam  Constitutional: She is oriented to person, place, and time. She appears well-developed. No distress.  HENT:  Head: Normocephalic and atraumatic.  Mouth/Throat: Oropharynx is clear and  moist.  Hoarseness noted.  Cardiovascular: Normal rate and regular rhythm.  Pulmonary/Chest: Effort normal and breath sounds normal. She has no wheezes. She has no rales.  Neurological: She is alert and oriented to person, place, and time.  Psychiatric: She has a normal mood and affect. Her behavior is normal.  Nursing note and vitals reviewed.  UC Treatments / Results  Labs (all labs ordered are listed, but only abnormal results are displayed) Labs Reviewed - No data to display  EKG None Radiology No results found.  Procedures Procedures (including critical care time)  Medications Ordered in UC Medications - No data to display   Initial Impression / Assessment and Plan / UC Course  I have reviewed the triage vital signs and the nursing notes.  Pertinent labs & imaging results that were available during my care of the patient were reviewed by me and considered in my medical decision making (see chart for details).     37 year old female presents with laryngitis and bronchitis.  Likely viral in origin.  Continue albuterol.  Tessalon Perles and prednisone as prescribed.  Final Clinical Impressions(s) / UC Diagnoses   Final diagnoses:  Laryngitis  Bronchitis    ED Discharge Orders        Ordered    albuterol (PROVENTIL HFA;VENTOLIN HFA) 108 (90 Base) MCG/ACT inhaler  Every 6 hours PRN     06/25/17 1312    benzonatate (TESSALON) 100 MG capsule  3 times daily PRN     06/25/17 1312    predniSONE (DELTASONE) 50 MG tablet     06/25/17 1313     Controlled Substance Prescriptions [place] Controlled Substance Registry consulted? Not Applicable   Tommie SamsCook, Nyzir Dubois G, DO 06/25/17 1407

## 2017-06-25 NOTE — ED Triage Notes (Signed)
Patient c/o cough / chest congestion and loss her voice x over a week.

## 2019-07-17 ENCOUNTER — Ambulatory Visit
Admission: EM | Admit: 2019-07-17 | Discharge: 2019-07-17 | Disposition: A | Discharge status: Home / Self Care | Payer: Self-pay | Attending: Family Medicine | Admitting: Family Medicine

## 2019-07-17 ENCOUNTER — Encounter: Payer: Self-pay | Admitting: Emergency Medicine

## 2019-07-17 ENCOUNTER — Other Ambulatory Visit: Payer: Self-pay

## 2019-07-17 DIAGNOSIS — I10 Essential (primary) hypertension: Secondary | ICD-10-CM

## 2019-07-17 HISTORY — DX: Arteritis, unspecified: I77.6

## 2019-07-17 HISTORY — DX: Antineutrophilic cytoplasmic antibody (ANCA) vasculitis: I77.82

## 2019-07-17 MED ORDER — HYDROCHLOROTHIAZIDE 25 MG PO TABS: 25.0000 mg | ORAL_TABLET | Freq: Every day | ORAL | 0 refills | Status: AC

## 2019-07-17 NOTE — ED Provider Notes (Signed)
[facility]    CSN: [id number] Arrival date & time: 07/17/19  1743      History   Chief Complaint Chief Complaint  Patient presents with  . Hypertension  . Headache  . Spots and/or Floaters   HPI  39 year old female presents with the above complaints.  Patient reports that her hypertension has been worse over the past week.  Blood pressure elevated here.  Patient endorses compliance with her home medication of losartan and HCTZ.  Patient reports that she has had multiple symptoms.  She reports headache, head pressure.  She also reports floaters, fatigue, nausea, palpitations, back pain.  No relieving factors.  No known exacerbating factors.  She has not seen her primary care physician in over a year.  No other associated symptoms.  No other complaints.  Past Medical History:  Diagnosis Date  . ANCA-positive vasculitis (HCC)   . Anemia   . Anxiety   . Asthma   . Depression   . Fibromyalgia   . GERD (gastroesophageal reflux disease)   . Hypertension   . Vitamin D deficiency    Past Surgical History:  Procedure Laterality Date  . ABDOMINAL HYSTERECTOMY    . CESAREAN SECTION     2005; 2011  . CHOLECYSTECTOMY    . uterine ablation     OB History   No obstetric history on file.    Home Medications    Prior to Admission medications   Medication Sig Start Date End Date Taking? Authorizing Provider  albuterol (PROVENTIL HFA;VENTOLIN HFA) 108 (90 Base) MCG/ACT inhaler Inhale 1-2 puffs into the lungs every 6 (six) hours as needed for wheezing or shortness of breath. 06/25/17  Yes Jamey Demchak G, DO  losartan (COZAAR) 50 MG tablet Take by mouth. 03/26/19 03/25/20 Yes [provider]  omeprazole (PRILOSEC) 40 MG capsule Take 40 mg by mouth daily.   Yes [provider]  lisinopril-hydrochlorothiazide (PRINZIDE,ZESTORETIC) 20-12.5 MG per tablet Take 1 tablet by mouth daily.  07/17/19 Yes [provider]  hydrochlorothiazide (HYDRODIURIL) 25 MG  tablet Take 1 tablet (25 mg total) by mouth daily. 07/17/19 07/16/20  Tommie Sams, DO  clonazePAM (KLONOPIN) 0.5 MG tablet Take 0.5 mg by mouth 2 (two) times daily as needed for anxiety.  07/17/19  [provider]  gabapentin (NEURONTIN) 600 MG tablet Take 600 mg by mouth 3 (three) times daily.  07/17/19  [provider]  QUEtiapine (SEROQUEL) 400 MG tablet Take 400 mg by mouth at bedtime.  07/17/19  [provider]  sertraline (ZOLOFT) 100 MG tablet Take 100 mg by mouth daily.  07/17/19  [provider]    Family History Family History  Problem Relation Age of Onset  . Thyroid disease Mother     Social History Social History   Tobacco Use  . Smoking status: Never Smoker  . Smokeless tobacco: Never Used  Substance Use Topics  . Alcohol use: Not Currently  . Drug use: Never     Allergies   Prevacid [lansoprazole] and Shellfish allergy   Review of Systems Review of Systems Per HPI  Physical Exam Triage Vital Signs ED Triage Vitals  Enc Vitals Group     BP 07/17/19 1828 (!) 148/102     Pulse Rate 07/17/19 1828 90     Resp 07/17/19 1828 18     Temp 07/17/19 1828 98.3 F (36.8 C)     Temp Source 07/17/19 1828 Oral     SpO2 07/17/19 1828 100 %  Weight 07/17/19 1823 298 lb (135.2 kg)     Height 07/17/19 1823 5\' 6"  (1.676 m)     Head Circumference --      Peak Flow --      Pain Score 07/17/19 1823 0     Pain Loc --      Pain Edu? --      Excl. in [place]? --    Updated Vital Signs BP (!) 148/102 (BP Location: Left Arm)   Pulse 90   Temp 98.3 F (36.8 C) (Oral)   Resp 18   Ht 5\' 6"  (1.676 m)   Wt 135.2 kg   SpO2 100%   BMI 48.10 kg/m   Visual Acuity Right Eye Distance:   Left Eye Distance:   Bilateral Distance:    Right Eye Near:   Left Eye Near:    Bilateral Near:     Physical Exam Constitutional:      General: She is not in acute distress.    Appearance: She is well-developed. She is obese. She is not ill-appearing.    HENT:     Head: Normocephalic and atraumatic.  Eyes:     General:        Right eye: No discharge.        Left eye: No discharge.     Conjunctiva/sclera: Conjunctivae normal.  Cardiovascular:     Rate and Rhythm: Normal rate and regular rhythm.     Heart sounds: No murmur.  Pulmonary:     Effort: Pulmonary effort is normal.     Breath sounds: Normal breath sounds. No wheezing, rhonchi or rales.  Neurological:     Mental Status: She is alert.  Psychiatric:        Mood and Affect: Mood normal.        Behavior: Behavior normal.    UC Treatments / Results  Labs (all labs ordered are listed, but only abnormal results are displayed) Labs Reviewed - No data to display  EKG   Radiology No results found.  Procedures Procedures (including critical care time)  Medications Ordered in UC Medications - No data to display  Initial Impression / Assessment and Plan / UC Course  I have reviewed the triage vital signs and the nursing notes.  Pertinent labs & imaging results that were available during my care of the patient were reviewed by me and considered in my medical decision making (see chart for details).    39 year old female presents with exacerbation/uncontrolled hypertension.  Increasing HCTZ to 25 mg daily.  Continue losartan 50 mg daily.  Follow-up with PCP in the next 7 to 10 days.  Needs labs.  Supportive care.  Final Clinical Impressions(s) / UC Diagnoses   Final diagnoses:  Essential hypertension     Discharge Instructions     Medication as prescribed - 25 mg of HCTZ daily. Continue Losartan 50 mg daily.  Call PCP for an appt. You need to be re-evaluated and get labs in 7-10 days.  Take care  Dr. Lacinda Axon    ED Prescriptions    Medication Sig Dispense Auth. Provider   hydrochlorothiazide (HYDRODIURIL) 25 MG tablet Take 1 tablet (25 mg total) by mouth daily. 90 tablet Thersa Salt G, DO     PDMP not reviewed this encounter.   Coral Spikes, [place] 07/17/19  1955

## 2019-07-17 NOTE — ED Triage Notes (Signed)
Patient reports history of BP since she was 19. She takes medication for BP but for the last week her BP has been elevated. She c/o pressure in her head, visual changes and back pain.

## 2019-07-17 NOTE — Discharge Instructions (Signed)
Medication as prescribed - 25 mg of HCTZ daily. Continue Losartan 50 mg daily.  Call PCP for an appt. You need to be re-evaluated and get labs in 7-10 days.  Take care  Dr. Adriana Simas

## 2020-10-03 ENCOUNTER — Emergency Department
Admission: EM | Admit: 2020-10-03 | Discharge: 2020-10-04 | Disposition: A | Discharge status: Home / Self Care | Payer: Medicaid Other | Attending: Emergency Medicine | Admitting: Emergency Medicine

## 2020-10-03 ENCOUNTER — Emergency Department: Payer: Medicaid Other

## 2020-10-03 ENCOUNTER — Other Ambulatory Visit: Payer: Self-pay

## 2020-10-03 ENCOUNTER — Encounter: Payer: Self-pay | Admitting: Radiology

## 2020-10-03 DIAGNOSIS — R197 Diarrhea, unspecified: Secondary | ICD-10-CM | POA: Insufficient documentation

## 2020-10-03 DIAGNOSIS — J45909 Unspecified asthma, uncomplicated: Secondary | ICD-10-CM | POA: Insufficient documentation

## 2020-10-03 DIAGNOSIS — R0602 Shortness of breath: Secondary | ICD-10-CM | POA: Insufficient documentation

## 2020-10-03 DIAGNOSIS — I10 Essential (primary) hypertension: Secondary | ICD-10-CM | POA: Insufficient documentation

## 2020-10-03 DIAGNOSIS — R42 Dizziness and giddiness: Secondary | ICD-10-CM | POA: Insufficient documentation

## 2020-10-03 DIAGNOSIS — R079 Chest pain, unspecified: Secondary | ICD-10-CM | POA: Insufficient documentation

## 2020-10-03 DIAGNOSIS — R110 Nausea: Secondary | ICD-10-CM | POA: Insufficient documentation

## 2020-10-03 DIAGNOSIS — Z79899 Other long term (current) drug therapy: Secondary | ICD-10-CM | POA: Insufficient documentation

## 2020-10-03 DIAGNOSIS — Z20822 Contact with and (suspected) exposure to covid-19: Secondary | ICD-10-CM | POA: Insufficient documentation

## 2020-10-03 LAB — BASIC METABOLIC PANEL
Anion gap: 8 (ref 5–15)
BUN: 10 mg/dL (ref 6–20)
CO2: 27 mmol/L (ref 22–32)
Calcium: 8.9 mg/dL (ref 8.9–10.3)
Chloride: 102 mmol/L (ref 98–111)
Creatinine, Ser: 0.98 mg/dL (ref 0.44–1.00)
GFR, Estimated: >60 mL/min (ref >60)
Glucose, Bld: 105 mg/dL — ABNORMAL HIGH (ref 70–99)
Potassium: 3.5 mmol/L (ref 3.5–5.1)
Sodium: 137 mmol/L (ref 135–145)

## 2020-10-03 LAB — CBC
HCT: 39.8 % (ref 36.0–46.0)
Hemoglobin: 13.1 g/dL (ref 12.0–15.0)
MCH: 29.3 pg (ref 26.0–34.0)
MCHC: 32.9 g/dL (ref 30.0–36.0)
MCV: 89 fL (ref 80.0–100.0)
Platelets: 507 10*3/uL — ABNORMAL HIGH (ref 150–400)
RBC: 4.47 MIL/uL (ref 3.87–5.11)
RDW: 13.2 % (ref 11.5–15.5)
WBC: 9.1 10*3/uL (ref 4.0–10.5)
nRBC: 0 % (ref 0.0–0.2)

## 2020-10-03 LAB — TROPONIN I (HIGH SENSITIVITY)
Troponin I (High Sensitivity): 4 ng/L (ref <18)
Troponin I (High Sensitivity): 3 ng/L (ref <18)

## 2020-10-03 LAB — RESP PANEL BY RT-PCR (FLU A&B, COVID) ARPGX2
Influenza A by PCR: NEGATIVE
Influenza B by PCR: NEGATIVE
SARS Coronavirus 2 by RT PCR: NEGATIVE

## 2020-10-03 IMAGING — CT CT ANGIO CHEST
2 of 6 series · 18 of 46 positions shown · IV contrast (APPLIED)
Comparison: None.

CLINICAL DATA: Left side chest pain

EXAM:
CT ANGIOGRAPHY CHEST WITH CONTRAST
TECHNIQUE: Multidetector CT imaging of the chest was performed using the
standard protocol during bolus administration of intravenous
contrast. Multiplanar CT image reconstructions and MIPs were
obtained to evaluate the vascular anatomy.
CONTRAST:  100mL OMNIPAQUE IOHEXOL 350 MG/ML SOLN

[Series 5: thins · axial · 0.81mm/px · z∈[-683,-404]mm · 15 of 381 slices shown]
[im 16/381  lung]
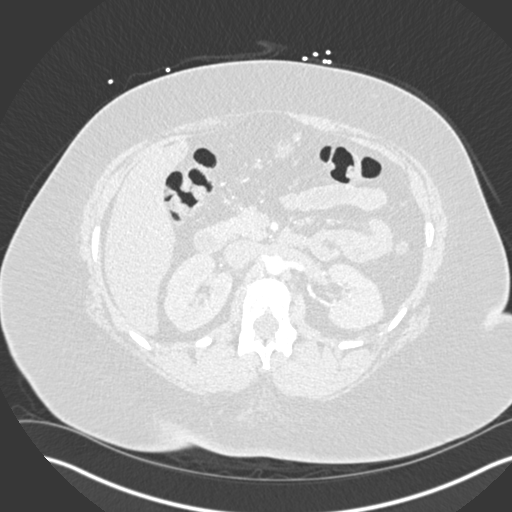
[im 48/381  soft-tissue]
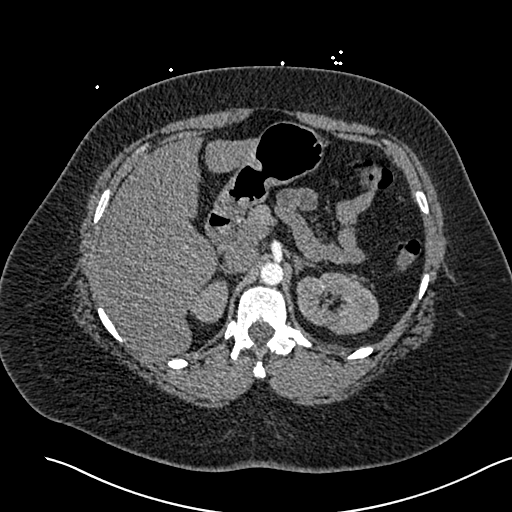
[im 64/381  lung]
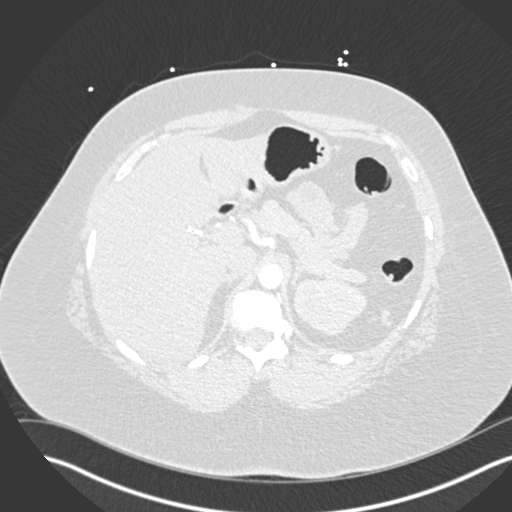
[im 96/381  soft-tissue]
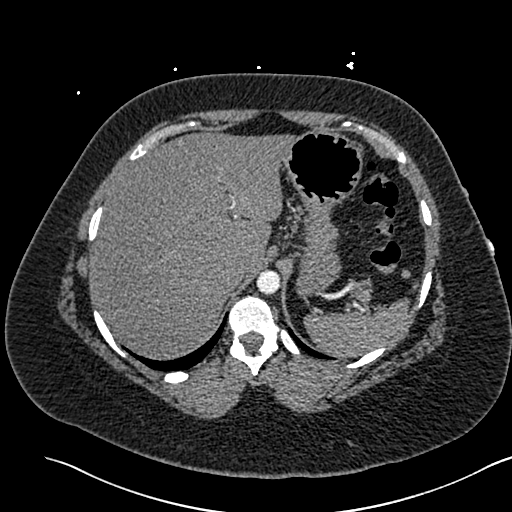
[im 111/381  lung]
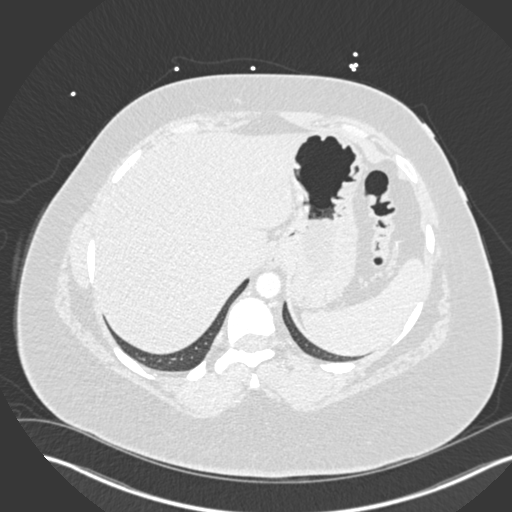
[im 143/381  soft-tissue]
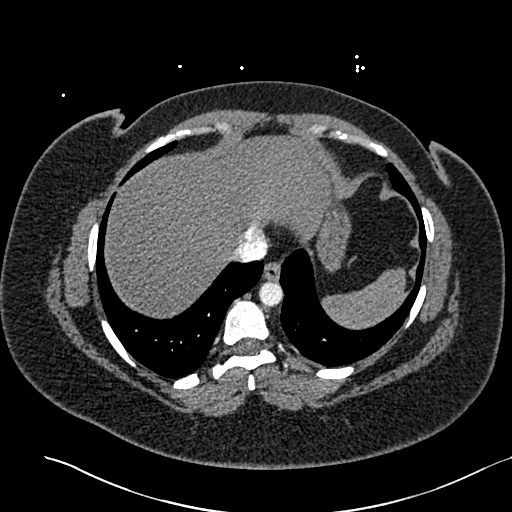
[im 159/381  lung]
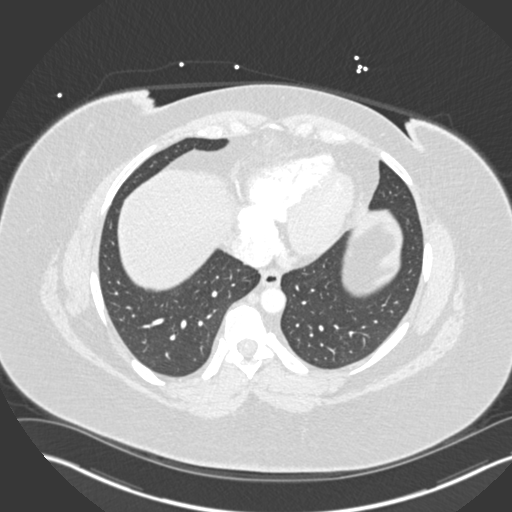
[im 191/381  soft-tissue]
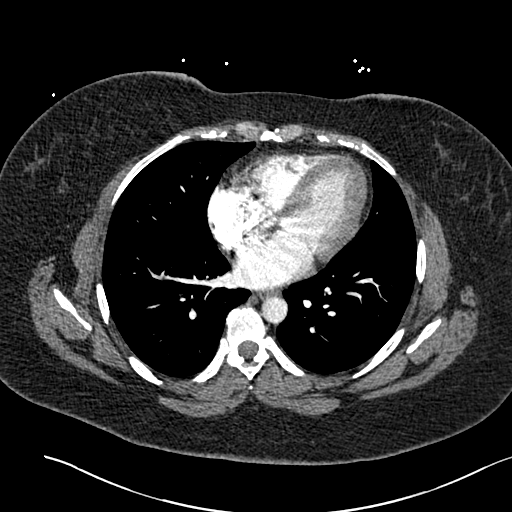
[im 222/381  lung]
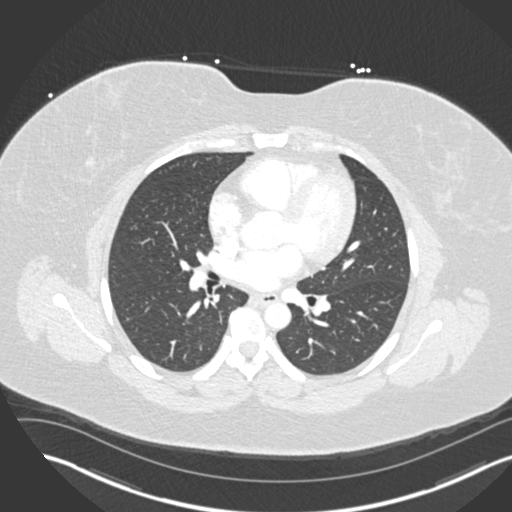
[im 238/381  soft-tissue]
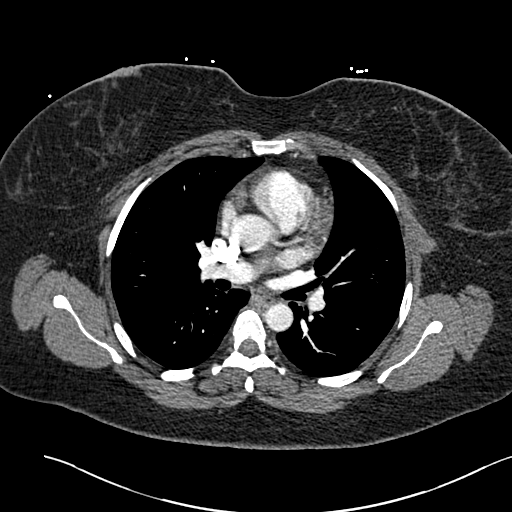
[im 270/381  lung]
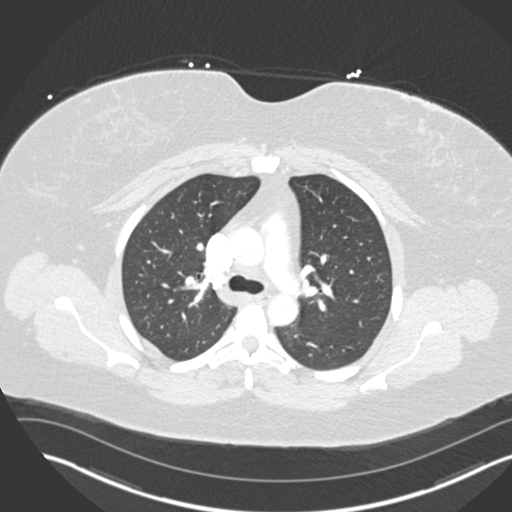
[im 286/381  soft-tissue]
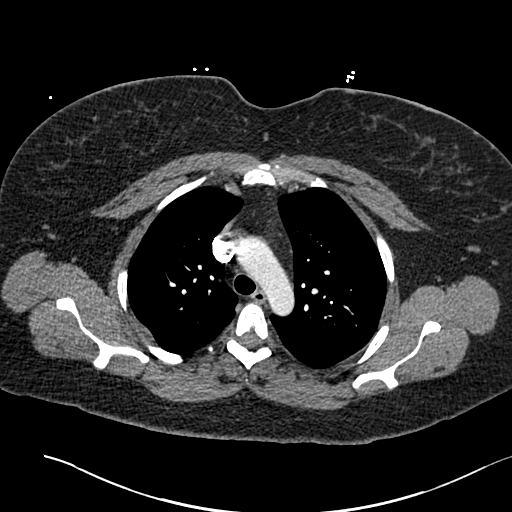
[im 317/381  lung]
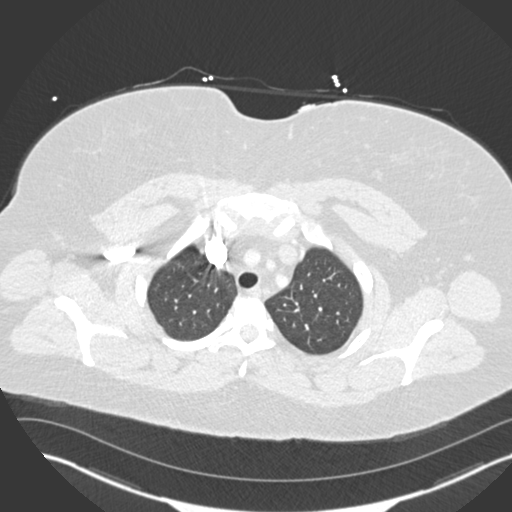
[im 333/381  soft-tissue]
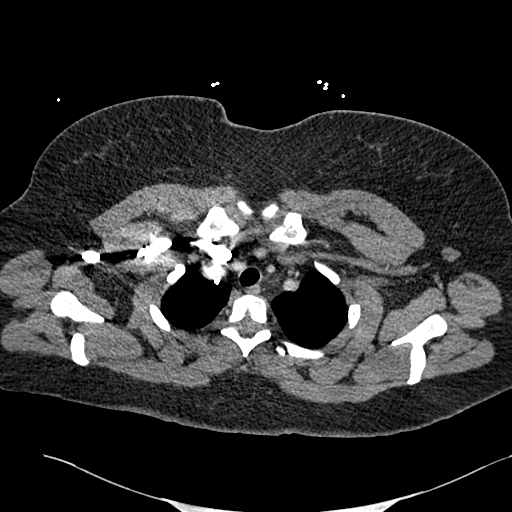
[im 365/381  lung]
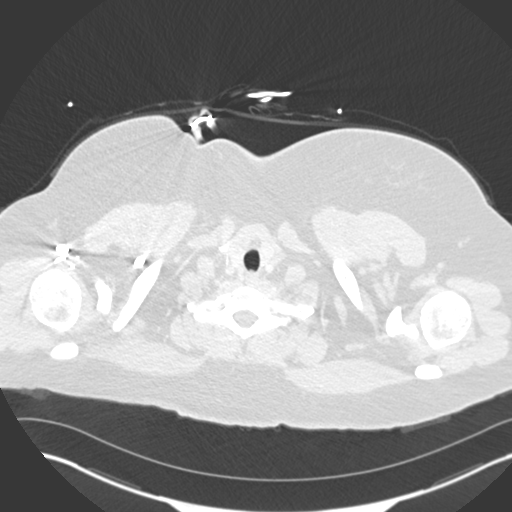

[Series 7: coronal mpr · coronal · 0.60mm/px · 3 of 106 slices shown]
[im 27/106  soft-tissue]
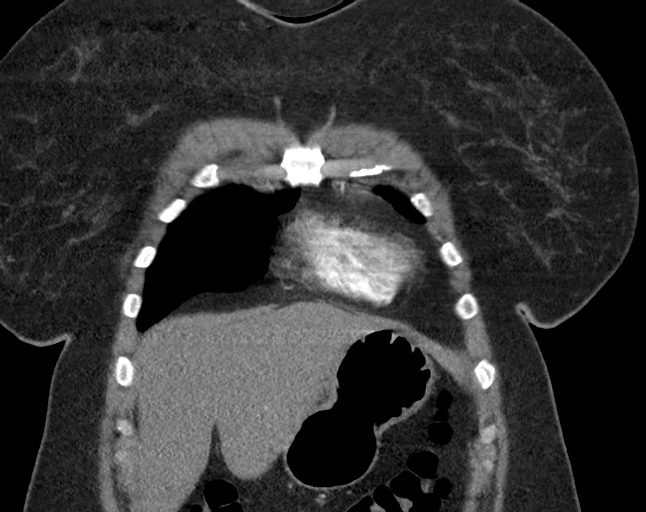
[im 53/106  soft-tissue]
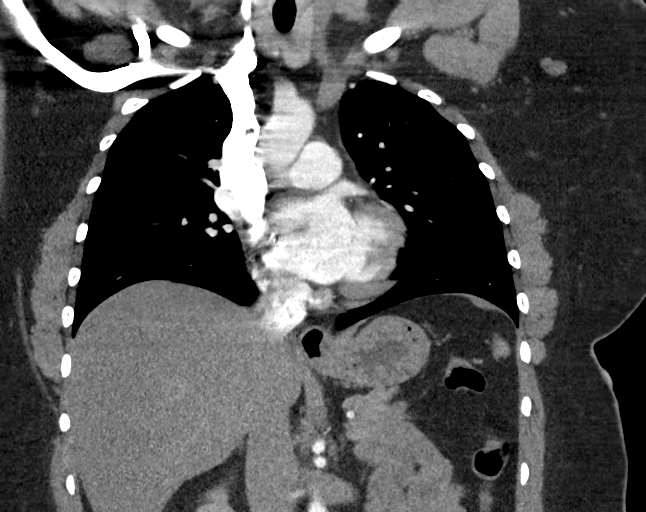
[im 79/106  soft-tissue]
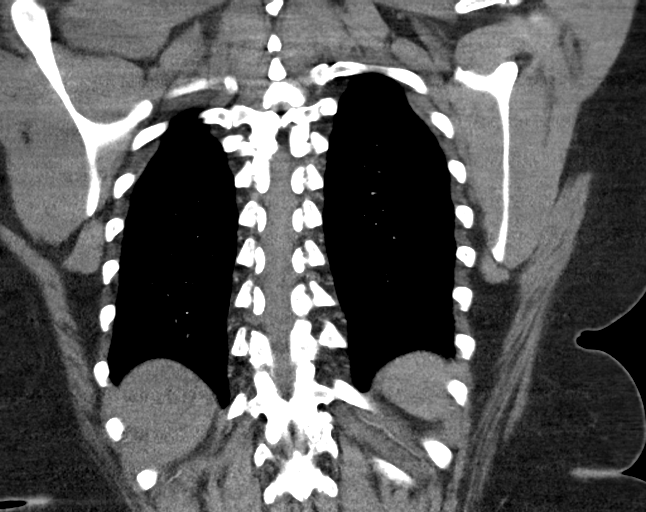

[18 of 46 positions shown; findings below may reference images not displayed]

FINDINGS: Cardiovascular: No filling defects in the pulmonary arteries to
suggest pulmonary emboli. Heart is normal size. Aorta is normal
caliber.

Mediastinum/Nodes: No mediastinal, hilar, or axillary adenopathy.
Trachea and esophagus are unremarkable. Thyroid unremarkable.

Lungs/Pleura: Lungs are clear. No focal airspace opacities or
suspicious nodules. No effusions.

Upper Abdomen: Imaging into the upper abdomen demonstrates no acute
findings.

Musculoskeletal: Chest wall soft tissues are unremarkable. No acute
bony abnormality.

Review of the MIP images confirms the above findings.
IMPRESSION: No evidence of pulmonary embolus.

No acute cardiopulmonary disease.

## 2020-10-03 MED ORDER — LACTATED RINGERS IV BOLUS
1000.0000 mL | Freq: Once | INTRAVENOUS | Status: AC
  Administered 2020-10-03: 1000 mL via INTRAVENOUS

## 2020-10-03 MED ORDER — KETOROLAC TROMETHAMINE 30 MG/ML IJ SOLN
15.0000 mg | Freq: Once | INTRAMUSCULAR | Status: AC
  Administered 2020-10-03: 15 mg via INTRAVENOUS
  Filled 2020-10-03: qty 1

## 2020-10-03 MED ORDER — IOHEXOL 350 MG/ML SOLN
100.0000 mL | Freq: Once | INTRAVENOUS | Status: AC | PRN
  Administered 2020-10-03: 100 mL via INTRAVENOUS

## 2020-10-03 NOTE — ED Triage Notes (Signed)
Pt presents via POV c/o chest since Monday. Reports left sided chest radiating into left shoulder/back. Also reports SOB. PMH HTN

## 2020-10-03 NOTE — ED Provider Notes (Signed)
[facility] Emergency Department Provider Note  ____________________________________________   Event Date/Time   First MD Initiated Contact with Patient 10/03/20 2116     (approximate)  I have reviewed the triage vital signs and the nursing notes.   HISTORY  Chief Complaint Chest Pain   HPI Madeline Carr is a 40 y.o. female with a past medical history anxiety, asthma, depression, fibromyalgia, ANCA positive vasculitis and hypertension who presents for assessment of approximately 1 week of some left-sided chest pain radiating into the left back associate with some lightheadedness nausea and diarrhea.  She denies any headache, earache, sore throat, cough, abdominal pain, vomiting, rash or urinary symptoms.  No recent injuries or falls.  She denies any other acute concerns at this time.         Past Medical History:  Diagnosis Date   ANCA-positive vasculitis (HCC)    Anemia    Anxiety    Asthma    Depression    Fibromyalgia    GERD (gastroesophageal reflux disease)    Hypertension    Vitamin D deficiency     There are no problems to display for this patient.   Past Surgical History:  Procedure Laterality Date   ABDOMINAL HYSTERECTOMY     CESAREAN SECTION     2005; 2011   CHOLECYSTECTOMY     uterine ablation      Prior to Admission medications   Medication Sig Start Date End Date Taking? Authorizing Provider  lisinopril-hydrochlorothiazide (PRINZIDE,ZESTORETIC) 20-12.5 MG per tablet Take 1 tablet by mouth daily.  07/17/19  [provider]  albuterol (PROVENTIL HFA;VENTOLIN HFA) 108 (90 Base) MCG/ACT inhaler Inhale 1-2 puffs into the lungs every 6 (six) hours as needed for wheezing or shortness of breath. 06/25/17   Tommie Sams, DO  hydrochlorothiazide (HYDRODIURIL) 25 MG tablet Take 1 tablet (25 mg total) by mouth daily. 07/17/19 07/16/20  Tommie Sams, DO  losartan (COZAAR) 50 MG tablet Take by mouth. 03/26/19 03/25/20  [provider]  omeprazole (PRILOSEC) 40 MG capsule Take 40 mg by mouth daily.    [provider]  clonazePAM (KLONOPIN) 0.5 MG tablet Take 0.5 mg by mouth 2 (two) times daily as needed for anxiety.  07/17/19  [provider]  gabapentin (NEURONTIN) 600 MG tablet Take 600 mg by mouth 3 (three) times daily.  07/17/19  [provider]  QUEtiapine (SEROQUEL) 400 MG tablet Take 400 mg by mouth at bedtime.  07/17/19  [provider]  sertraline (ZOLOFT) 100 MG tablet Take 100 mg by mouth daily.  07/17/19  [provider]    Allergies Prevacid [lansoprazole] and Shellfish allergy  Family History  Problem Relation Age of Onset   Thyroid disease Mother     Social History Social History   Tobacco Use   Smoking status: Never   Smokeless tobacco: Never  Vaping Use   Vaping Use: Never used  Substance Use Topics   Alcohol use: Not Currently   Drug use: Never    Review of Systems  Review of Systems  Constitutional:  Negative for chills and fever.  HENT:  Negative for sore throat.   Eyes:  Negative for pain.  Respiratory:  Positive for shortness of breath. Negative for cough and stridor.   Cardiovascular:  Positive for chest pain.  Gastrointestinal:  Positive for diarrhea and nausea. Negative for vomiting.  Genitourinary:  Negative for dysuria.  Musculoskeletal:  Negative for myalgias.  Skin:  Negative for rash.  Neurological:  Positive for dizziness. Negative for seizures, loss of consciousness and headaches.  Psychiatric/Behavioral:  Negative for suicidal ideas.   All other systems reviewed and are negative.    ____________________________________________   PHYSICAL EXAM:  VITAL SIGNS: ED Triage Vitals  Enc Vitals Group     BP 10/03/20 1631 (!) 145/118     Pulse Rate 10/03/20 1631 (!) 103     Resp 10/03/20 1631 14     Temp 10/03/20 1631 98.5 F (36.9 C)     Temp Source 10/03/20 1631 Oral     SpO2 10/03/20 1631 97 %     Weight --       Height --      Head Circumference --      Peak Flow --      Pain Score 10/03/20 1632 4     Pain Loc --      Pain Edu? --      Excl. in GC? --    Vitals:   10/03/20 1631 10/03/20 2245  BP: (!) 145/118 124/89  Pulse: (!) 103 68  Resp: 14 16  Temp: 98.5 F (36.9 C)   SpO2: 97% 99%   Physical Exam Vitals and nursing note reviewed.  Constitutional:      General: She is not in acute distress.    Appearance: She is well-developed.  HENT:     Head: Normocephalic and atraumatic.     Right Ear: External ear normal.     Left Ear: External ear normal.     Nose: Nose normal.  Eyes:     Conjunctiva/sclera: Conjunctivae normal.  Cardiovascular:     Rate and Rhythm: Normal rate and regular rhythm.     Pulses: Normal pulses.     Heart sounds: No murmur heard. Pulmonary:     Effort: Pulmonary effort is normal. No respiratory distress.     Breath sounds: Normal breath sounds.  Abdominal:     Palpations: Abdomen is soft.     Tenderness: There is no abdominal tenderness.  Musculoskeletal:     Cervical back: Neck supple.  Skin:    General: Skin is warm and dry.     Capillary Refill: Capillary refill takes less than 2 seconds.  Neurological:     Mental Status: She is alert and oriented to person, place, and time.  Psychiatric:        Mood and Affect: Mood normal.     ____________________________________________   LABS (all labs ordered are listed, but only abnormal results are displayed)  Labs Reviewed  CBC - Abnormal; Notable for the following components:      Result Value   Platelets 507 (*)    All other components within normal limits  BASIC METABOLIC PANEL - Abnormal; Notable for the following components:   Glucose, Bld 105 (*)    All other components within normal limits  RESP PANEL BY RT-PCR (FLU A&B, COVID) ARPGX2  TROPONIN I (HIGH SENSITIVITY)  TROPONIN I (HIGH SENSITIVITY)   ____________________________________________  EKG  Sinus tachycardia with  ventricular to 105, normal axis, unremarkable intervals without clearance of acute ischemia or significant arrhythmia but some nonspecific change in V3. ____________________________________________  RADIOLOGY  ED MD interpretation: CTA chest shows no evidence of PE, pneumonia, thorax, edema, effusion or any other clear acute intrathoracic process.  Official radiology report(s): CT Angio Chest PE W and/or Wo Contrast  Result Date: 10/03/2020 CLINICAL DATA:  Left side chest pain EXAM: CT ANGIOGRAPHY CHEST WITH CONTRAST TECHNIQUE: Multidetector CT imaging  of the chest was performed using the standard protocol during bolus administration of intravenous contrast. Multiplanar CT image reconstructions and MIPs were obtained to evaluate the vascular anatomy. CONTRAST:  OMNIPAQUE IOHEXOL 350 MG/ML SOLN COMPARISON:  None. FINDINGS: Cardiovascular: No filling defects in the pulmonary arteries to suggest pulmonary emboli. Heart is normal size. Aorta is normal caliber. Mediastinum/Nodes: No mediastinal, hilar, or axillary adenopathy. Trachea and esophagus are unremarkable. Thyroid unremarkable. Lungs/Pleura: Lungs are clear. No focal airspace opacities or suspicious nodules. No effusions. Upper Abdomen: Imaging into the upper abdomen demonstrates no acute findings. Musculoskeletal: Chest wall soft tissues are unremarkable. No acute bony abnormality. Review of the MIP images confirms the above findings. IMPRESSION: No evidence of pulmonary embolus. No acute cardiopulmonary disease. Electronically Signed   By: Charlett Nose M.D.   On: 10/03/2020 23:35    ____________________________________________   PROCEDURES  Procedure(s) performed (including Critical Care):  Procedures   ____________________________________________   INITIAL IMPRESSION / ASSESSMENT AND PLAN / ED COURSE      Patient presents with above to history exam for assessment about a week of some left-sided chest pain associate with  shortness of breath little nausea and diarrhea.  She states for little lightheaded today.  Differential includes arrhythmia, ACS, PE, pneumonia, dehydration, metabolic derangements, pericarditis, myocarditis and pleurisy.   CTA chest shows no evidence of PE, pneumonia, thorax, edema, effusion or any other clear acute intrathoracic process.  ECG has no evidence of arrhythmia and given nonelevated troponin x2 I have a low suspicion for ACS or myocarditis.  COVID influenza is negative.  BMP shows no significant electrode or metabolic derangements.  CBC has no significant derangements.  Patient given IV fluids and Toradol.  On reassessment she states she feels much better.  Given stable vitals with otherwise reassuring exam and work-up patient states she feels much better suspect likely viral syndrome and a component of pleurisy.  Discharged stable condition.  Strict return precautions advised and discussed.  Instructed to follow-up with PCP.       ____________________________________________   FINAL CLINICAL IMPRESSION(S) / ED DIAGNOSES  Final diagnoses:  Chest pain, unspecified type    Medications  ketorolac (TORADOL) 30 MG/ML injection 15 mg (15 mg Intravenous Given 10/03/20 2305)  iohexol (OMNIPAQUE) 350 MG/ML injection 100 mL (100 mLs Intravenous Contrast Given 10/03/20 2316)  lactated ringers bolus 1,000 mL (1,000 mLs Intravenous New Bag/Given 10/03/20 2336)     ED Discharge Orders     None        Note:  This document was prepared using Dragon voice recognition software and may include unintentional dictation errors.    Gilles Chiquito, MD 10/04/20 0000

## 2021-04-07 ENCOUNTER — Ambulatory Visit: Payer: Self-pay

## 2022-06-03 ENCOUNTER — Ambulatory Visit
Admission: EM | Admit: 2022-06-03 | Discharge: 2022-06-03 | Disposition: A | Discharge status: Home / Self Care | Payer: Medicaid Other

## 2022-06-03 DIAGNOSIS — J069 Acute upper respiratory infection, unspecified: Secondary | ICD-10-CM

## 2022-06-03 DIAGNOSIS — J4521 Mild intermittent asthma with (acute) exacerbation: Secondary | ICD-10-CM

## 2022-06-03 LAB — POCT RAPID STREP A (OFFICE): Rapid Strep A Screen: NEGATIVE

## 2022-06-03 MED ORDER — PREDNISONE 10 MG PO TABS: 40.0000 mg | ORAL_TABLET | Freq: Every day | ORAL | 0 refills | Status: AC

## 2022-06-03 NOTE — Discharge Instructions (Addendum)
Continue to use your albuterol inhaler as directed.  Take the prednisone as directed.    Your strep test is negative.    Follow up with your primary care provider.

## 2022-06-03 NOTE — ED Provider Notes (Signed)
Roderic Palau    CSN: [id number] Arrival date & time: 06/03/22  1327      History   Chief Complaint Chief Complaint  Patient presents with   Sore Throat   Otalgia    HPI Madeline Carr is a 42 y.o. female.   Patient presents with ear pain, sore throat, congestion, postnasal drip, cough since last night.  Treating with OTC cold medication.  She felt short of breath with coughing last night and used her albuterol inhaler; no use today.  She denies fever, chest pain, current shortness of breath, or other symptoms.   Patient was seen by her rheumatologist on 05/24/2022 and is being referred to ENT at [facility] for sinusitis, nasal ulcers, and tinnitus.  Her medical history includes ANCA-positive vasculitis, essential myoclonus, fibromyalgia, hypertension, asthma, anxiety, depression.   The history is provided by the patient and medical records.    Past Medical History:  Diagnosis Date   ANCA-positive vasculitis (HCC)    Anemia    Anxiety    Asthma    Depression    Fibromyalgia    GERD (gastroesophageal reflux disease)    Hypertension    Vitamin D deficiency     There are no problems to display for this patient.   Past Surgical History:  Procedure Laterality Date   ABDOMINAL HYSTERECTOMY     CESAREAN SECTION     2005; 2011   CHOLECYSTECTOMY     uterine ablation      OB History   No obstetric history on file.      Home Medications    Prior to Admission medications   Medication Sig Start Date End Date Taking? Authorizing Provider  amLODipine (NORVASC) 5 MG tablet Take 1 tablet by mouth daily. 03/09/22 03/09/23 Yes [provider]  co-enzyme Q-10 30 MG capsule Take by mouth. 08/13/12  Yes [provider]  predniSONE (DELTASONE) 10 MG tablet Take 4 tablets (40 mg total) by mouth daily for 5 days. 06/03/22 06/08/22 Yes Sharion Balloon, NP  albuterol (PROVENTIL HFA;VENTOLIN HFA) 108 (90 Base) MCG/ACT inhaler Inhale 1-2 puffs into the lungs every 6 (six)  hours as needed for wheezing or shortness of breath. 06/25/17   Cook, Barnie Del, DO  clonazePAM (KLONOPIN) 0.5 MG tablet TAKE ONE TABLET BY MOUTH TWICE A DAY AS NEEDED FOR ANXIETY 06/06/22   [provider]  DULoxetine (CYMBALTA) 60 MG capsule Take by mouth. Patient not taking: Reported on 06/03/2022 08/13/12   [provider]  FLUoxetine (PROZAC) 20 MG capsule Take 1 capsule by mouth daily. Patient not taking: Reported on 06/03/2022 03/31/22 03/31/23  [provider]  hydrochlorothiazide (HYDRODIURIL) 25 MG tablet Take 1 tablet (25 mg total) by mouth daily. 07/17/19 07/16/20  Coral Spikes, DO  losartan (COZAAR) 50 MG tablet Take by mouth. 03/26/19 03/25/20  [provider]  omeprazole (PRILOSEC) 40 MG capsule Take 40 mg by mouth daily.    [provider]  gabapentin (NEURONTIN) 600 MG tablet Take 600 mg by mouth 3 (three) times daily.  07/17/19  [provider]  lisinopril-hydrochlorothiazide (PRINZIDE,ZESTORETIC) 20-12.5 MG per tablet Take 1 tablet by mouth daily.  07/17/19  [provider]  QUEtiapine (SEROQUEL) 400 MG tablet Take 400 mg by mouth at bedtime.  07/17/19  [provider]  sertraline (ZOLOFT) 100 MG tablet Take 100 mg by mouth daily.  07/17/19  [provider]    Family History Family History  Problem Relation Age of Onset  Thyroid disease Mother     Social History Social History   Tobacco Use   Smoking status: Never   Smokeless tobacco: Never  Vaping Use   Vaping Use: Never used  Substance Use Topics   Alcohol use: Not Currently   Drug use: Never     Allergies   Prevacid [lansoprazole] and Shellfish allergy   Review of Systems Review of Systems  Constitutional:  Negative for chills and fever.  HENT:  Positive for congestion, ear pain, postnasal drip, rhinorrhea and sore throat.   Respiratory:  Positive for cough and shortness of breath.   Cardiovascular:  Negative for chest pain and  palpitations.  Skin:  Negative for rash.  All other systems reviewed and are negative.    Physical Exam Triage Vital Signs ED Triage Vitals  Enc Vitals Group     BP      Pulse      Resp      Temp      Temp src      SpO2      Weight      Height      Head Circumference      Peak Flow      Pain Score      Pain Loc      Pain Edu?      Excl. in [place]?    No data found.  Updated Vital Signs BP 114/78   Pulse 92   Temp 98.1 F (36.7 C)   Resp 17   Ht 5\' 7"  (1.702 m)   Wt 293 lb (132.9 kg)   SpO2 98%   BMI 45.89 kg/m   Visual Acuity Right Eye Distance:   Left Eye Distance:   Bilateral Distance:    Right Eye Near:   Left Eye Near:    Bilateral Near:     Physical Exam Vitals and nursing note reviewed.  Constitutional:      General: She is not in acute distress.    Appearance: She is well-developed. She is obese. She is not ill-appearing.  HENT:     Right Ear: Tympanic membrane normal.     Left Ear: Tympanic membrane normal.     Nose: Rhinorrhea present.     Mouth/Throat:     Mouth: Mucous membranes are moist.     Pharynx: Oropharynx is clear.     Comments: Clear PND. Cardiovascular:     Rate and Rhythm: Normal rate and regular rhythm.     Heart sounds: Normal heart sounds.  Pulmonary:     Effort: Pulmonary effort is normal. No respiratory distress.     Breath sounds: Normal breath sounds. No wheezing.  Musculoskeletal:     Cervical back: Neck supple.  Skin:    General: Skin is warm and dry.     Capillary Refill: Capillary refill takes less than 2 seconds.  Neurological:     Mental Status: She is alert.  Psychiatric:        Mood and Affect: Mood normal.        Behavior: Behavior normal.      UC Treatments / Results  Labs (all labs ordered are listed, but only abnormal results are displayed) Labs Reviewed  POCT RAPID STREP A (OFFICE)    EKG   Radiology No results found.  Procedures Procedures (including critical care time)  Medications  Ordered in UC Medications - No data to display  Initial Impression / Assessment and Plan / UC Course  I have reviewed the  triage vital signs and the nursing notes.  Pertinent labs & imaging results that were available during my care of the patient were reviewed by me and considered in my medical decision making (see chart for details).    Viral URI, Asthma exacerbation.  Afebrile and vital signs are stable.  Lungs are clear and O2 sat is 98% on room air.  Because patient reports shortness of breath with coughing episodes last night, treating with prednisone and continued use of albuterol inhaler.  Rapid strep negative.  Patient declines COVID test here and will do test at home.  Instructed patient to follow up with her PCP.  Provided on upper respiratory infection and asthma.  She agrees to plan of care.    Final Clinical Impressions(s) / UC Diagnoses   Final diagnoses:  Viral URI  Mild intermittent asthma with acute exacerbation     Discharge Instructions      Continue to use your albuterol inhaler as directed.  Take the prednisone as directed.    Your strep test is negative.    Follow up with your primary care provider.        ED Prescriptions     Medication Sig Dispense Auth. Provider   predniSONE (DELTASONE) 10 MG tablet Take 4 tablets (40 mg total) by mouth daily for 5 days. 20 tablet Sharion Balloon, NP      PDMP not reviewed this encounter.   Sharion Balloon, NP 06/03/22 [phone]

## 2022-06-03 NOTE — ED Triage Notes (Signed)
Patient to Urgent Care with complaints of cough, sore throat and bilateral ear pain. Sinus pain and pressure. Post nasal drip. Symptoms started last night.  Using albuterol inhaler/ cough syrup otc cold/ flu medications.

## 2022-08-06 ENCOUNTER — Ambulatory Visit
Admission: RE | Admit: 2022-08-06 | Discharge: 2022-08-06 | Disposition: A | Discharge status: Home / Self Care | Payer: Medicaid Other | Source: Ambulatory Visit

## 2022-08-06 VITALS — BP 116/83 | HR 105 | Temp 98.1°F | Resp 18 | Wt 282.2 lb

## 2022-08-06 DIAGNOSIS — J069 Acute upper respiratory infection, unspecified: Secondary | ICD-10-CM | POA: Diagnosis not present

## 2022-08-06 DIAGNOSIS — J302 Other seasonal allergic rhinitis: Secondary | ICD-10-CM

## 2022-08-06 LAB — POCT RAPID STREP A (OFFICE): Rapid Strep A Screen: NEGATIVE

## 2022-08-06 NOTE — ED Triage Notes (Signed)
Patient to Urgent Care with complaints of cough/ sore throat/ hoarseness. Poor appetite.   Reports symptoms started 8 days ago. No fevers.  Hx of asthma, using her inhaler and nebs with little relief. Using tylenol/ mucinex/ teas/ ginger/tea/ lemon.

## 2022-08-06 NOTE — ED Provider Notes (Signed)
Renaldo Fiddler    CSN: [id number] Arrival date & time: 08/06/22  1038      History   Chief Complaint Chief Complaint  Patient presents with   Sore Throat    Cough and loss of voice - Entered by patient    HPI Madeline Carr is a 42 y.o. female.  Patient presents with 8 day history of sore throat, hoarse voice, cough, decreased appetite.  Treating with Tylenol and Mucinex.  No fever, wheezing, shortness of breath, chest pain, or other symptoms.  Patient was seen here on 06/03/2022; diagnosed with viral URI and asthma exacerbation; treated with prednisone.  The history is provided by the patient and medical records.    Past Medical History:  Diagnosis Date   ANCA-positive vasculitis (HCC)    Anemia    Anxiety    Asthma    Depression    Fibromyalgia    GERD (gastroesophageal reflux disease)    Hypertension    Vitamin D deficiency     There are no problems to display for this patient.   Past Surgical History:  Procedure Laterality Date   ABDOMINAL HYSTERECTOMY     CESAREAN SECTION     2005; 2011   CHOLECYSTECTOMY     uterine ablation      OB History   No obstetric history on file.      Home Medications    Prior to Admission medications   Medication Sig Start Date End Date Taking? Authorizing Provider  FLUoxetine (PROZAC) 40 MG capsule Take 40 mg by mouth daily. 07/08/22  Yes [provider]  albuterol (PROVENTIL HFA;VENTOLIN HFA) 108 (90 Base) MCG/ACT inhaler Inhale 1-2 puffs into the lungs every 6 (six) hours as needed for wheezing or shortness of breath. 06/25/17   Tommie Sams, DO  amLODipine (NORVASC) 5 MG tablet Take 1 tablet by mouth daily. 03/09/22 03/09/23  [provider]  clonazePAM (KLONOPIN) 0.5 MG tablet TAKE ONE TABLET BY MOUTH TWICE A DAY AS NEEDED FOR ANXIETY 06/06/22   [provider]  co-enzyme Q-10 30 MG capsule Take by mouth. 08/13/12   [provider]  DULoxetine (CYMBALTA) 60 MG capsule Take by  mouth. Patient not taking: Reported on 06/03/2022 08/13/12   [provider]  FLUoxetine (PROZAC) 20 MG capsule Take 1 capsule by mouth daily. Patient not taking: Reported on 06/03/2022 03/31/22 03/31/23  [provider]  hydrochlorothiazide (HYDRODIURIL) 25 MG tablet Take 1 tablet (25 mg total) by mouth daily. 07/17/19 07/16/20  Tommie Sams, DO  losartan (COZAAR) 50 MG tablet Take by mouth. 03/26/19 03/25/20  [provider]  omeprazole (PRILOSEC) 40 MG capsule Take 40 mg by mouth daily.    [provider]  gabapentin (NEURONTIN) 600 MG tablet Take 600 mg by mouth 3 (three) times daily.  07/17/19  [provider]  lisinopril-hydrochlorothiazide (PRINZIDE,ZESTORETIC) 20-12.5 MG per tablet Take 1 tablet by mouth daily.  07/17/19  [provider]  QUEtiapine (SEROQUEL) 400 MG tablet Take 400 mg by mouth at bedtime.  07/17/19  [provider]  sertraline (ZOLOFT) 100 MG tablet Take 100 mg by mouth daily.  07/17/19  [provider]    Family History Family History  Problem Relation Age of Onset   Thyroid disease Mother     Social History Social History   Tobacco Use   Smoking status: Never   Smokeless tobacco: Never  Vaping Use   Vaping Use: Never used  Substance Use Topics  Alcohol use: Not Currently   Drug use: Never     Allergies   Prevacid [lansoprazole] and Shellfish allergy   Review of Systems Review of Systems  Constitutional:  Positive for appetite change. Negative for chills and fever.  HENT:  Positive for sore throat and voice change. Negative for ear pain.   Respiratory:  Positive for cough. Negative for shortness of breath and wheezing.   Cardiovascular:  Negative for chest pain and palpitations.     Physical Exam Triage Vital Signs ED Triage Vitals  Enc Vitals Group     BP 08/06/22 1052 116/83     Pulse Rate 08/06/22 1048 (!) 105     Resp 08/06/22 1048 18     Temp 08/06/22 1048 98.1 F (36.7 C)      Temp src --      SpO2 08/06/22 1048 97 %     Weight 08/06/22 1055 282 lb 3.2 oz (128 kg)     Height --      Head Circumference --      Peak Flow --      Pain Score 08/06/22 1050 3     Pain Loc --      Pain Edu? --      Excl. in [place]? --    No data found.  Updated Vital Signs BP 116/83   Pulse (!) 105   Temp 98.1 F (36.7 C)   Resp 18   Wt 282 lb 3.2 oz (128 kg)   SpO2 97%   BMI 44.20 kg/m   Visual Acuity Right Eye Distance:   Left Eye Distance:   Bilateral Distance:    Right Eye Near:   Left Eye Near:    Bilateral Near:     Physical Exam Vitals and nursing note reviewed.  Constitutional:      General: She is not in acute distress.    Appearance: She is well-developed. She is not ill-appearing.  HENT:     Right Ear: Tympanic membrane normal.     Left Ear: Tympanic membrane normal.     Nose: Nose normal.     Mouth/Throat:     Mouth: Mucous membranes are moist.     Pharynx: Oropharynx is clear.     Comments: Clear PND.   Cardiovascular:     Rate and Rhythm: Normal rate and regular rhythm.     Heart sounds: Normal heart sounds.  Pulmonary:     Effort: Pulmonary effort is normal. No respiratory distress.     Breath sounds: Normal breath sounds. No wheezing.  Musculoskeletal:     Cervical back: Neck supple.  Skin:    General: Skin is warm and dry.  Neurological:     Mental Status: She is alert.  Psychiatric:        Mood and Affect: Mood normal.        Behavior: Behavior normal.      UC Treatments / Results  Labs (all labs ordered are listed, but only abnormal results are displayed) Labs Reviewed  POCT RAPID STREP A (OFFICE)    EKG   Radiology No results found.  Procedures Procedures (including critical care time)  Medications Ordered in UC Medications - No data to display  Initial Impression / Assessment and Plan / UC Course  I have reviewed the triage vital signs and the nursing notes.  Pertinent labs & imaging results that were  available during my care of the patient were reviewed by me and considered in my medical decision  making (see chart for details).    Viral URI, Seasonal allergies.  Lungs are clear, O2 sat 97% on room air.  Rapid strep negative.  Discussed symptomatic treatment including Tylenol or ibuprofen.  Instructed patient to follow up with her PCP if her symptoms are not improving.  Education provided viral URI.  She agrees to plan of care.   Final Clinical Impressions(s) / UC Diagnoses   Final diagnoses:  Viral URI  Seasonal allergies     Discharge Instructions      Follow up with your primary care provider if your symptoms are not improving.        ED Prescriptions   None    PDMP not reviewed this encounter.   Mickie Bail, NP 08/06/22 [phone]

## 2022-08-06 NOTE — Discharge Instructions (Addendum)
Follow up with your primary care provider if your symptoms are not improving.     

## 2023-08-22 ENCOUNTER — Other Ambulatory Visit: Payer: Self-pay | Admitting: Family

## 2023-08-22 DIAGNOSIS — Z1231 Encounter for screening mammogram for malignant neoplasm of breast: Secondary | ICD-10-CM

## 2024-01-04 ENCOUNTER — Ambulatory Visit
Admission: RE | Admit: 2024-01-04 | Discharge: 2024-01-04 | Disposition: A | Discharge status: Home / Self Care | Payer: MEDICAID | Source: Ambulatory Visit | Attending: Emergency Medicine | Admitting: Emergency Medicine

## 2024-01-04 VITALS — BP 118/84 | HR 88 | Temp 98.1°F | Resp 18

## 2024-01-04 DIAGNOSIS — H6502 Acute serous otitis media, left ear: Secondary | ICD-10-CM

## 2024-01-04 DIAGNOSIS — J029 Acute pharyngitis, unspecified: Secondary | ICD-10-CM

## 2024-01-04 LAB — POCT RAPID STREP A (OFFICE): Rapid Strep A Screen: NEGATIVE

## 2024-01-04 MED ORDER — AMOXICILLIN-POT CLAVULANATE 875-125 MG PO TABS: 1.0000 | ORAL_TABLET | Freq: Two times a day (BID) | ORAL | 0 refills | Status: AC

## 2024-01-04 NOTE — ED Provider Notes (Signed)
 [facility]    CSN: [id number] Arrival date & time: 01/04/24  1021      History   Chief Complaint Chief Complaint  Patient presents with   Sore Throat    Entered by patient    HPI Madeline Carr is a 43 y.o. female.   Patient presents for evaluation of sore throat present for 3 days.  Associated bilateral ear fullness, chills, body aches, mild nonproductive cough and nausea without vomiting.  No known sick contacts.  Tolerable to food and liquids but appetite is decreased.  Denies fever or nasal congestion.  Has attempted use of DayQuil, NyQuil, Tylenol, Aleve, hot tea and ginger ale.  Past Medical History:  Diagnosis Date   ANCA-positive vasculitis (HCC)    Anemia    Anxiety    Asthma    Depression    Fibromyalgia    GERD (gastroesophageal reflux disease)    Hypertension    Vitamin D deficiency     There are no active problems to display for this patient.   Past Surgical History:  Procedure Laterality Date   ABDOMINAL HYSTERECTOMY     CESAREAN SECTION     2005; 2011   CHOLECYSTECTOMY     uterine ablation      OB History   No obstetric history on file.      Home Medications    Prior to Admission medications   Medication Sig Start Date End Date Taking? Authorizing Provider  amoxicillin-clavulanate (AUGMENTIN) 875-125 MG tablet Take 1 tablet by mouth every 12 (twelve) hours. 01/04/24  Yes [place] Hanlon, Shelba SAUNDERS, NP  budesonide-formoterol (SYMBICORT) 80-4.5 MCG/ACT inhaler Inhale 2 puffs into the lungs 2 (two) times daily. 09/24/22  Yes [provider]  celecoxib (CELEBREX) 200 MG capsule Take 200 mg by mouth daily. 09/23/23  Yes [provider]  cyclobenzaprine (FLEXERIL) 10 MG tablet Take 10 mg by mouth at bedtime. 09/23/23  Yes [provider]  albuterol  (PROVENTIL  HFA;VENTOLIN  HFA) 108 (90 Base) MCG/ACT inhaler Inhale 1-2 puffs into the lungs every 6 (six) hours as needed for wheezing or shortness of breath. 06/25/17   Cook,  Jayce G, DO  amLODipine (NORVASC) 5 MG tablet Take 1 tablet by mouth daily. 03/09/22 03/09/23  [provider]  clonazePAM (KLONOPIN) 0.5 MG tablet TAKE ONE TABLET BY MOUTH TWICE A DAY AS NEEDED FOR ANXIETY 06/06/22   [provider]  co-enzyme Q-10 30 MG capsule Take by mouth. 08/13/12   [provider]  DULoxetine (CYMBALTA) 60 MG capsule Take by mouth. Patient not taking: Reported on 06/03/2022 08/13/12   [provider]  FLUoxetine (PROZAC) 20 MG capsule Take 1 capsule by mouth daily. Patient not taking: Reported on 06/03/2022 03/31/22 03/31/23  [provider]  FLUoxetine (PROZAC) 40 MG capsule Take 40 mg by mouth daily. 07/08/22   [provider]  hydrochlorothiazide  (HYDRODIURIL ) 25 MG tablet Take 1 tablet (25 mg total) by mouth daily. 07/17/19 07/16/20  Cook, Jayce G, DO  losartan (COZAAR) 50 MG tablet Take by mouth. 03/26/19 03/25/20  [provider]  omeprazole (PRILOSEC) 40 MG capsule Take 40 mg by mouth daily.    [provider]  gabapentin (NEURONTIN) 600 MG tablet Take 600 mg by mouth 3 (three) times daily.  07/17/19  [provider]  lisinopril-hydrochlorothiazide  (PRINZIDE,ZESTORETIC) 20-12.5 MG per tablet Take 1 tablet by mouth daily.  07/17/19  [provider]  QUEtiapine (SEROQUEL) 400 MG tablet Take 400 mg by mouth at bedtime.  07/17/19  [provider]  sertraline (ZOLOFT) 100 MG tablet Take 100 mg by mouth daily.  07/17/19  [provider]    Family History Family History  Problem Relation Age of Onset   Thyroid disease Mother     Social History Social History   Tobacco Use   Smoking status: Never   Smokeless tobacco: Never  Vaping Use   Vaping status: Never Used  Substance Use Topics   Alcohol use: Not Currently   Drug use: Never     Allergies   Prevacid [lansoprazole] and Shellfish allergy   Review of Systems Review of Systems   Physical Exam Triage Vital  Signs ED Triage Vitals  Encounter Vitals Group     BP 01/04/24 1055 118/84     Girls Systolic BP Percentile --      Girls Diastolic BP Percentile --      Boys Systolic BP Percentile --      Boys Diastolic BP Percentile --      Pulse Rate 01/04/24 1055 88     Resp 01/04/24 1055 18     Temp 01/04/24 1055 98.1 F (36.7 C)     Temp Source 01/04/24 1055 Oral     SpO2 01/04/24 1055 98 %     Weight --      Height --      Head Circumference --      Peak Flow --      Pain Score 01/04/24 1052 5     Pain Loc --      Pain Education --      Exclude from Growth Chart --    No data found.  Updated Vital Signs BP 118/84 (BP Location: Right Arm)   Pulse 88   Temp 98.1 F (36.7 C) (Oral)   Resp 18   SpO2 98%   Visual Acuity Right Eye Distance:   Left Eye Distance:   Bilateral Distance:    Right Eye Near:   Left Eye Near:    Bilateral Near:     Physical Exam Constitutional:      Appearance: Normal appearance.  HENT:     Right Ear: Hearing, tympanic membrane, ear canal and external ear normal.     Left Ear: Ear canal and external ear normal. Tympanic membrane is erythematous.     Nose: Congestion present.     Mouth/Throat:     Pharynx: No oropharyngeal exudate or posterior oropharyngeal erythema.  Eyes:     Extraocular Movements: Extraocular movements intact.  Cardiovascular:     Rate and Rhythm: Normal rate and regular rhythm.     Pulses: Normal pulses.     Heart sounds: Normal heart sounds.  Pulmonary:     Effort: Pulmonary effort is normal.     Breath sounds: Normal breath sounds.  Lymphadenopathy:     Cervical: Cervical adenopathy present.  Neurological:     Mental Status: She is alert and oriented to person, place, and time. Mental status is at baseline.      UC Treatments / Results  Labs (all labs ordered are listed, but only abnormal results are displayed) Labs Reviewed  POCT RAPID STREP A (OFFICE) - Normal    EKG   Radiology No results  found.  Procedures Procedures (including critical care time)  Medications Ordered in UC Medications - No data to display  Initial Impression / Assessment and Plan / UC Course  I have reviewed the triage vital signs and the nursing notes.  Pertinent labs & imaging  results that were available during my care of the patient were reviewed by me and considered in my medical decision making (see chart for details).  Nonrecurrent acute serous otitis media of the left ear, sore throat  Erythema present to the left tympanic membrane without abnormality to the canal or external ear, no abnormality to the  oropharynx, rapid strep test negative, discussed findings, prescribed Augmentin, recommend over-the-counter medications with follow-up with Final Clinical Impressions(s) / UC Diagnoses   Final diagnoses:  Sore throat  Non-recurrent acute serous otitis media of left ear     Discharge Instructions      Today you are being treated for an infection of the eardrum, left side  Take Augmentin twice daily for 7 days days, you should begin to see improvement after 48 hours of medication use and then it should progressively get better  You may use Tylenol or ibuprofen for management of discomfort  May hold warm compresses to the ear for additional comfort  Please not attempted any ear cleaning or object or fluid placement into the ear canal to prevent further irritation    For cough: honey 1/2 to 1 teaspoon (you can dilute the honey in water or another fluid).  You can also use guaifenesin and dextromethorphan for cough. You can use a humidifier for chest congestion and cough.  If you don't have a humidifier, you can sit in the bathroom with the hot shower running.      For sore throat: try warm salt water gargles, cepacol lozenges, throat spray, warm tea or water with lemon/honey, popsicles or ice, or OTC cold relief medicine for throat discomfort.   For congestion: take a daily anti-histamine  like Zyrtec, Claritin, and a oral decongestant, such as pseudoephedrine.  You can also use Flonase 1-2 sprays in each nostril daily.   It is important to stay hydrated: drink plenty of fluids (water, gatorade/powerade/pedialyte, juices, or teas) to keep your throat moisturized and help further relieve irritation/discomfort.    ED Prescriptions     Medication Sig Dispense Auth. Provider   amoxicillin-clavulanate (AUGMENTIN) 875-125 MG tablet Take 1 tablet by mouth every 12 (twelve) hours. 14 tablet Dmiyah Liscano R, NP      PDMP not reviewed this encounter.   Teresa Shelba SAUNDERS, NP 01/04/24 1517

## 2024-01-04 NOTE — ED Triage Notes (Signed)
 Patient reports sore throat that started on Sunday. Patient has taken Dayquil/Tylenol and Aleve with no relief. Last was Tylenol and Aleve this morning.  Rates sore throat 5/10.

## 2024-01-04 NOTE — Discharge Instructions (Signed)
 Today you are being treated for an infection of the eardrum, left side  Take Augmentin twice daily for 7 days days, you should begin to see improvement after 48 hours of medication use and then it should progressively get better  You may use Tylenol or ibuprofen for management of discomfort  May hold warm compresses to the ear for additional comfort  Please not attempted any ear cleaning or object or fluid placement into the ear canal to prevent further irritation    For cough: honey 1/2 to 1 teaspoon (you can dilute the honey in water or another fluid).  You can also use guaifenesin and dextromethorphan for cough. You can use a humidifier for chest congestion and cough.  If you don't have a humidifier, you can sit in the bathroom with the hot shower running.      For sore throat: try warm salt water gargles, cepacol lozenges, throat spray, warm tea or water with lemon/honey, popsicles or ice, or OTC cold relief medicine for throat discomfort.   For congestion: take a daily anti-histamine like Zyrtec, Claritin, and a oral decongestant, such as pseudoephedrine.  You can also use Flonase 1-2 sprays in each nostril daily.   It is important to stay hydrated: drink plenty of fluids (water, gatorade/powerade/pedialyte, juices, or teas) to keep your throat moisturized and help further relieve irritation/discomfort.
